# Patient Record
Sex: Female | Born: 1985 | Race: White | Hispanic: No | Marital: Single | Smoking: Never smoker
Health system: Southern US, Community
[De-identification: ages and names within clinical notes are randomized; demographics above are authoritative.]

---

## 2019-09-05 ENCOUNTER — Encounter (HOSPITAL_COMMUNITY): Payer: Self-pay | Admitting: Emergency Medicine

## 2019-09-05 ENCOUNTER — Emergency Department (HOSPITAL_COMMUNITY): Payer: Self-pay

## 2019-09-05 ENCOUNTER — Emergency Department (HOSPITAL_COMMUNITY)
Admission: EM | Admit: 2019-09-05 | Discharge: 2019-09-05 | Disposition: A | Discharge status: Home / Self Care | Payer: Self-pay | Attending: Emergency Medicine | Admitting: Emergency Medicine

## 2019-09-05 DIAGNOSIS — M5442 Lumbago with sciatica, left side: Secondary | ICD-10-CM | POA: Insufficient documentation

## 2019-09-05 MED ORDER — METHOCARBAMOL 500 MG PO TABS: 500.0000 mg | ORAL_TABLET | Freq: Two times a day (BID) | ORAL | 0 refills | Status: DC

## 2019-09-05 MED ORDER — MELOXICAM 7.5 MG PO TABS: 7.5000 mg | ORAL_TABLET | Freq: Every day | ORAL | 0 refills | Status: DC

## 2019-09-05 MED ORDER — HYDROCODONE-ACETAMINOPHEN 5-325 MG PO TABS
1.0000 | ORAL_TABLET | Freq: Once | ORAL | Status: AC
  Administered 2019-09-05: 1 via ORAL
  Filled 2019-09-05: qty 1

## 2019-09-05 MED ORDER — PREDNISONE 20 MG PO TABS: 40.0000 mg | ORAL_TABLET | Freq: Every day | ORAL | 0 refills | Status: AC

## 2019-09-05 MED ORDER — LIDOCAINE 5 % EX PTCH
1.0000 | MEDICATED_PATCH | CUTANEOUS | Status: DC
  Administered 2019-09-05: 16:00:00 1 via TRANSDERMAL
  Filled 2019-09-05: qty 1

## 2019-09-05 NOTE — ED Provider Notes (Signed)
[facility] EMERGENCY DEPARTMENT Provider Note   CSN: [id number] Arrival date & time: 09/05/19  1237     History Chief Complaint  Patient presents with  . Leg Pain    Roberta Bowman is a 33 y.o. female with no significant past medical history who presents to the ED due to gradual onset of worsening left lower back pain that radiates into the posterior aspect of the left leg for the past 3 to 4 days.  Patient notes she has had this pain in the past but is typically relieved with over-the-counter pain medication.  Patient denies recent injury.  Patient has tried over-the-counter Aleve, ice, heat, and elevation without relief.  Patient describes the pain as a stabbing, burning, and throbbing sensation that is worse with movement and ambulation.  Pain is relieved by staying stationary.  Back pain is associated with slight tingling of the left foot.  Patient denies fever, chills, lower extremity weakness, bowel/bladder incontinence, saddle paresthesias, IV drug use, and history of cancer.  Patient denies shortness of breath and chest pain. Patient denies urinary symptoms.   History reviewed. No pertinent past medical history.  There are no problems to display for this patient.   History reviewed. No pertinent surgical history.   OB History   No obstetric history on file.     No family history on file.  Social History   Tobacco Use  . Smoking status: Not on file  Substance Use Topics  . Alcohol use: Not on file  . Drug use: Not on file    Home Medications Prior to Admission medications   Medication Sig Start Date End Date Taking? Authorizing Provider  meloxicam (MOBIC) 7.5 MG tablet Take 1 tablet (7.5 mg total) by mouth daily. 09/05/19   Mannie Stabile, PA-C  methocarbamol (ROBAXIN) 500 MG tablet Take 1 tablet (500 mg total) by mouth 2 (two) times daily. 09/05/19   Mannie Stabile, PA-C  predniSONE (DELTASONE) 20 MG tablet Take 2 tablets (40 mg total)  by mouth daily for 5 days. 09/05/19 09/10/19  Mannie Stabile, PA-C    Allergies    Patient has no allergy information on record.  Review of Systems   Review of Systems  Constitutional: Negative for chills and fever.  Respiratory: Negative for shortness of breath.   Cardiovascular: Negative for chest pain.  Gastrointestinal: Negative for abdominal pain, constipation, diarrhea, nausea and vomiting.  Genitourinary: Negative for dysuria, flank pain, frequency and pelvic pain.  Musculoskeletal: Positive for back pain and gait problem. Negative for joint swelling.  Neurological: Positive for numbness. Negative for weakness.  All other systems reviewed and are negative.   Physical Exam Updated Vital Signs BP 131/84 (BP Location: Left Arm)   Pulse 78   Temp 98.6 F (37 C) (Oral)   Resp 16   LMP 08/21/2019 (Approximate)   SpO2 98%   Physical Exam Vitals and nursing note reviewed.  Constitutional:      General: She is not in acute distress.    Appearance: She is obese. She is not ill-appearing.  HENT:     Head: Normocephalic.  Eyes:     Conjunctiva/sclera: Conjunctivae normal.  Cardiovascular:     Rate and Rhythm: Normal rate and regular rhythm.     Pulses: Normal pulses.     Heart sounds: Normal heart sounds. No murmur. No friction rub. No gallop.   Pulmonary:     Effort: Pulmonary effort is normal.     Breath sounds: Normal  breath sounds.  Abdominal:     General: Abdomen is flat. There is no distension.     Palpations: Abdomen is soft.     Tenderness: There is no abdominal tenderness. There is no right CVA tenderness, left CVA tenderness, guarding or rebound.  Musculoskeletal:     Cervical back: Normal range of motion and neck supple. No rigidity.     Comments: No T-spine and L-spine midline tenderness, no stepoff or deformity, left lumbar reproducible paraspinal tenderness including left buttocks region. No leg edema bilaterally Patient moves all extremities without  difficulty. DP/PT pulses 2+ and equal bilaterally Sensation grossly intact bilaterally Strength of knee flexion and extension is 5/5 Plantar and dorsiflexion of ankle 5/5 Achilles and patellar reflexes present and equal Able to ambulate with a limp Positive left straight leg test   Skin:    General: Skin is warm and dry.  Neurological:     General: No focal deficit present.     Mental Status: She is alert.  Psychiatric:        Mood and Affect: Mood normal.     ED Results / Procedures / Treatments   Labs (all labs ordered are listed, but only abnormal results are displayed) Labs Reviewed - No data to display  EKG None  Radiology DG Lumbar Spine Complete  Result Date: 09/05/2019 CLINICAL DATA:  Low back pain with left lower extremity radicular symptoms EXAM: LUMBAR SPINE - COMPLETE 4+ VIEW COMPARISON:  None. FINDINGS: Frontal, lateral, spot lumbosacral lateral, and bilateral oblique views were obtained. There are 5 non-rib-bearing lumbar type vertebral bodies. There is no fracture or spondylolisthesis. There is slight disc space narrowing at L2-3. Other disc spaces appear normal. There is no appreciable facet arthropathy. IMPRESSION: Slight disc space narrowing at L2-3. Other disc spaces appear unremarkable. No appreciable facet arthropathy. No fracture or spondylolisthesis. Electronically Signed   By: Lowella Grip III M.D.   On: 09/05/2019 15:18    Procedures Procedures (including critical care time)  Medications Ordered in ED Medications  lidocaine (LIDODERM) 5 % 1 patch (1 patch Transdermal Patch Applied 09/05/19 1604)  HYDROcodone-acetaminophen (NORCO/VICODIN) 5-325 MG per tablet 1 tablet (1 tablet Oral Given 09/05/19 1603)    ED Course  I have reviewed the triage vital signs and the nursing notes.  Pertinent labs & imaging results that were available during my care of the patient were reviewed by me and considered in my medical decision making (see chart for  details).      33 year old female presents to the ED due to left-sided low back pain that radiates into posterior aspect of left leg x3 to 4 days. Vitals all within normal limits. Patient in no acute distress and non-toxic appearing. Left sided lumbar paraspinal tenderness into buttocks region. Positive left straight leg test. Normal strength of lower extremities. Neurovascularly intact. Patient able to ambulate in ED with mild limp.  Broad differential for back pain considered includes malignancy, disc herniation, spinal epidural abscess, spinal fracture, cauda equina, pyelonephritis, kidney stone, AAA, AD, pancreatitis, PE and PTX.   History without red flags (cancer, IVDU, weakness, saddle anesthesia, trauma, weight loss) and physical exam most consistent with lumbar radiculopathy. Doubt cauda equina or disc herniation due to lack of saddle anesthesia/bowel or bladder incontinence or urinary retention, normal gait and reassuring physical examination.  History is not supportive of kidney stone, AAA, AD, pancreatitis, PE or PTX. Patient has no CVA tenderness or urinary symptoms to suggest pyelonephritis or kidney stone.   Lumbar x-ray  personally reviewed which is negative for bony fractures with slight disc narrowing between L2-3. Pain controlled here in ED with Hydrocodone and lidoderm patch. Will manage patient conservatively at this time. NSAIDs, back exercises/stretches, heat therapy and follow up with PCP if symptoms do not resolve in 3-4 weeks. Patient offered muscle relaxer for comfort at night. Patient advised that medication can cause drowsiness, so to not drive or operate machinery while on the medication. Patient also given short burst of prednisone. Patient denies history of DM. No history of DM in chart review. Counseled on need to return to ED for fever, worsening or concerning symptoms. Orthopedic number given patient at discharge and encouraged to call and schedule an appointment for  further evaluation. Strict ED precautions discussed with patient. Patient states understanding and agrees to plan. Patient discharged home in no acute distress and stable vitals  MDM Rules/Calculators/A&P                       Final Clinical Impression(s) / ED Diagnoses Final diagnoses:  Acute left-sided low back pain with left-sided sciatica    Rx / DC Orders ED Discharge Orders         Ordered    predniSONE (DELTASONE) 20 MG tablet  Daily     09/05/19 1627    methocarbamol (ROBAXIN) 500 MG tablet  2 times daily     09/05/19 1627    meloxicam (MOBIC) 7.5 MG tablet  Daily     09/05/19 1627           Mannie Stabileberman,  C, PA-C 09/05/19 1816    Virgina NorfolkCuratolo, Adam, DO 09/06/19 1554

## 2019-09-05 NOTE — ED Triage Notes (Signed)
Pt to ER for lower back pain radiating to left leg, reports it as shooting pain. States has tried OTC aleve, ice, and elevation without relief. Ambulatory, reports pain with doing so. Denies loss of bowel/bladder control. NAD at this time.

## 2019-09-05 NOTE — ED Notes (Signed)
Pt transported to xray 

## 2019-09-05 NOTE — Discharge Instructions (Addendum)
As discussed, your x-ray today was negative for any fractures. I have included the number of the orthopedic doctor for further evaluation of your low back pain. I am sending you home with prednisone which is a steroid. Take 40mg  daily for 5 days to help with inflammation. I am also sending you home with Robaxin which is muscle relaxer. You may take it twice a day. Medicine can make you drowsy, so do not drive or operate machinery while on the medication. 3rd medication is meloxicam which is a pain medication. You may take it once a day as needed for pain. Do not mix with other over the counter medication. You may purchase over the counter voltaren gel and lidoderm patches as needed for pain. If your symptoms do not improve within the next week, follow-up with your PCP. Return to the ER for new or worsening symptoms.

## 2020-12-29 ENCOUNTER — Emergency Department (HOSPITAL_BASED_OUTPATIENT_CLINIC_OR_DEPARTMENT_OTHER): Payer: Self-pay

## 2020-12-29 ENCOUNTER — Emergency Department (HOSPITAL_BASED_OUTPATIENT_CLINIC_OR_DEPARTMENT_OTHER)
Admission: EM | Admit: 2020-12-29 | Discharge: 2020-12-29 | Disposition: A | Discharge status: Home / Self Care | Payer: Self-pay | Attending: Emergency Medicine | Admitting: Emergency Medicine

## 2020-12-29 ENCOUNTER — Encounter (HOSPITAL_BASED_OUTPATIENT_CLINIC_OR_DEPARTMENT_OTHER): Payer: Self-pay | Admitting: Emergency Medicine

## 2020-12-29 ENCOUNTER — Other Ambulatory Visit: Payer: Self-pay

## 2020-12-29 DIAGNOSIS — R1032 Left lower quadrant pain: Secondary | ICD-10-CM | POA: Insufficient documentation

## 2020-12-29 LAB — CK: Total CK: 163 U/L (ref 38–234)

## 2020-12-29 LAB — URINALYSIS, ROUTINE W REFLEX MICROSCOPIC
Bilirubin Urine: NEGATIVE
Glucose, UA: NEGATIVE mg/dL
Ketones, ur: NEGATIVE mg/dL
Nitrite: NEGATIVE
Protein, ur: 30 mg/dL — AB
Specific Gravity, Urine: <1.005 — ABNORMAL LOW (ref 1.005–1.030)
pH: 5.5 (ref 5.0–8.0)

## 2020-12-29 LAB — COMPREHENSIVE METABOLIC PANEL
ALT: 27 U/L (ref 0–44)
AST: 26 U/L (ref 15–41)
Albumin: 4.2 g/dL (ref 3.5–5.0)
Alkaline Phosphatase: 95 U/L (ref 38–126)
Anion gap: 13 (ref 5–15)
BUN: 12 mg/dL (ref 6–20)
CO2: 23 mmol/L (ref 22–32)
Calcium: 9.1 mg/dL (ref 8.9–10.3)
Chloride: 100 mmol/L (ref 98–111)
Creatinine, Ser: 0.75 mg/dL (ref 0.44–1.00)
GFR, Estimated: >60 mL/min (ref >60)
Glucose, Bld: 124 mg/dL — ABNORMAL HIGH (ref 70–99)
Potassium: 3.5 mmol/L (ref 3.5–5.1)
Sodium: 136 mmol/L (ref 135–145)
Total Bilirubin: 1.1 mg/dL (ref 0.3–1.2)
Total Protein: 8.6 g/dL — ABNORMAL HIGH (ref 6.5–8.1)

## 2020-12-29 LAB — CBC
HCT: 34.8 % — ABNORMAL LOW (ref 36.0–46.0)
Hemoglobin: 10.2 g/dL — ABNORMAL LOW (ref 12.0–15.0)
MCH: 20 pg — ABNORMAL LOW (ref 26.0–34.0)
MCHC: 29.3 g/dL — ABNORMAL LOW (ref 30.0–36.0)
MCV: 68.4 fL — ABNORMAL LOW (ref 80.0–100.0)
Platelets: 394 10*3/uL (ref 150–400)
RBC: 5.09 MIL/uL (ref 3.87–5.11)
RDW: 18.3 % — ABNORMAL HIGH (ref 11.5–15.5)
WBC: 14.4 10*3/uL — ABNORMAL HIGH (ref 4.0–10.5)
nRBC: 0 % (ref 0.0–0.2)

## 2020-12-29 LAB — PREGNANCY, URINE: Preg Test, Ur: NEGATIVE

## 2020-12-29 LAB — LIPASE, BLOOD: Lipase: 26 U/L (ref 11–51)

## 2020-12-29 MED ORDER — IOHEXOL 300 MG/ML  SOLN
80.0000 mL | Freq: Once | INTRAMUSCULAR | Status: AC | PRN
  Administered 2020-12-29: 100 mL via INTRAVENOUS

## 2020-12-29 MED ORDER — SODIUM CHLORIDE 0.9 % IV BOLUS
500.0000 mL | Freq: Once | INTRAVENOUS | Status: AC
  Administered 2020-12-29: 500 mL via INTRAVENOUS

## 2020-12-29 MED ORDER — KETOROLAC TROMETHAMINE 30 MG/ML IJ SOLN
30.0000 mg | Freq: Once | INTRAMUSCULAR | Status: AC
  Administered 2020-12-29: 30 mg via INTRAVENOUS
  Filled 2020-12-29: qty 1

## 2020-12-29 NOTE — ED Triage Notes (Signed)
Patient reports to the ER BIB EMS: Patient reports LLQ abdominal pain that started at 0700 this morning. No N/V/D. No blood in stool or urine, denies chance of pregnancy. Has a hx of gallstones.  Patient's vitals WDL HR 90 RR 20 97% on RA 161/09 BP

## 2020-12-29 NOTE — ED Provider Notes (Signed)
[facility] EMERGENCY DEPT Provider Note   CSN: [id number] Arrival date & time: 12/29/20  1328     History No chief complaint on file.   Roberta Bowman is a 35 y.o. female.  She is here with a complaint of left lateral abdominal pain radiating to left lower quadrant that started at 7 AM woke her up.  Sharp and stabbing.  7 out of 10 intensity.  Does not radiate into her back or down the leg.  Has had some constipation.  No urinary symptoms.  No fevers chills nausea vomiting or diarrhea.  The history is provided by the patient and the EMS personnel.  Abdominal Pain Pain location:  LLQ Pain quality: sharp and stabbing   Pain radiates to:  Does not radiate Pain severity now: 7/10. Onset quality:  Sudden Timing:  Constant Progression:  Unchanged Chronicity:  New Context: awakening from sleep   Relieved by:  Nothing Worsened by:  Nothing Ineffective treatments:  None tried Associated symptoms: constipation   Associated symptoms: no chest pain, no cough, no diarrhea, no dysuria, no fever, no hematemesis, no hematochezia, no hematuria, no nausea, no shortness of breath, no sore throat and no vomiting        History reviewed. No pertinent past medical history.  There are no problems to display for this patient.   History reviewed. No pertinent surgical history.   OB History   No obstetric history on file.     No family history on file.  Social History   Tobacco Use  . Smoking status: Never Smoker  . Smokeless tobacco: Never Used  Substance Use Topics  . Alcohol use: Yes  . Drug use: Not Currently    Home Medications Prior to Admission medications   Medication Sig Start Date End Date Taking? Authorizing Provider  meloxicam (MOBIC) 7.5 MG tablet Take 1 tablet (7.5 mg total) by mouth daily. 09/05/19   Mannie Stabile, PA-C  methocarbamol (ROBAXIN) 500 MG tablet Take 1 tablet (500 mg total) by mouth 2 (two) times daily. 09/05/19   Mannie Stabile, PA-C    Allergies    Patient has no known allergies.  Review of Systems   Review of Systems  Constitutional: Negative for fever.  HENT: Negative for sore throat.   Eyes: Negative for visual disturbance.  Respiratory: Negative for cough and shortness of breath.   Cardiovascular: Negative for chest pain.  Gastrointestinal: Positive for abdominal pain and constipation. Negative for diarrhea, hematemesis, hematochezia, nausea and vomiting.  Genitourinary: Negative for dysuria and hematuria.  Musculoskeletal: Negative for neck pain.  Skin: Negative for rash.  Neurological: Negative for headaches.    Physical Exam Updated Vital Signs BP 122/87   Pulse 96   Temp 98 F (36.7 C) (Oral)   Resp 16   Ht 5\' 4"  (1.626 m)   Wt 129.3 kg   LMP 12/28/2020   SpO2 99%   BMI 48.92 kg/m   Physical Exam Vitals and nursing note reviewed.  Constitutional:      General: She is not in acute distress.    Appearance: Normal appearance. She is well-developed.  HENT:     Head: Normocephalic and atraumatic.  Eyes:     Conjunctiva/sclera: Conjunctivae normal.  Cardiovascular:     Rate and Rhythm: Normal rate and regular rhythm.     Heart sounds: No murmur heard.   Pulmonary:     Effort: Pulmonary effort is normal. No respiratory distress.     Breath sounds: Normal breath  sounds. No stridor. No wheezing.  Abdominal:     Palpations: Abdomen is soft.     Tenderness: There is no abdominal tenderness. There is no guarding or rebound.  Musculoskeletal:        General: No tenderness. Normal range of motion.     Cervical back: Neck supple.  Skin:    General: Skin is warm and dry.  Neurological:     General: No focal deficit present.     Mental Status: She is alert.     GCS: GCS eye subscore is 4. GCS verbal subscore is 5. GCS motor subscore is 6.     ED Results / Procedures / Treatments   Labs (all labs ordered are listed, but only abnormal results are displayed) Labs Reviewed   COMPREHENSIVE METABOLIC PANEL - Abnormal; Notable for the following components:      Result Value   Glucose, Bld 124 (*)    Total Protein 8.6 (*)    All other components within normal limits  CBC - Abnormal; Notable for the following components:   WBC 14.4 (*)    Hemoglobin 10.2 (*)    HCT 34.8 (*)    MCV 68.4 (*)    MCH 20.0 (*)    MCHC 29.3 (*)    RDW 18.3 (*)    All other components within normal limits  URINALYSIS, ROUTINE W REFLEX MICROSCOPIC - Abnormal; Notable for the following components:   Color, Urine BROWN (*)    APPearance HAZY (*)    Specific Gravity, Urine <1.005 (*)    Hgb urine dipstick LARGE (*)    Protein, ur 30 (*)    Leukocytes,Ua MODERATE (*)    Bacteria, UA RARE (*)    All other components within normal limits  LIPASE, BLOOD  PREGNANCY, URINE  CK    EKG None  Radiology CT Abdomen Pelvis W Contrast  Result Date: 12/29/2020 CLINICAL DATA:  Left lower quadrant abdominal pain, diverticulitis suspected EXAM: CT ABDOMEN AND PELVIS WITH CONTRAST TECHNIQUE: Multidetector CT imaging of the abdomen and pelvis was performed using the standard protocol following bolus administration of intravenous contrast. CONTRAST:  OMNIPAQUE IOHEXOL 300 MG/ML  SOLN COMPARISON:  04/06/2018 FINDINGS: Lower chest: No acute abnormality. Hepatobiliary: No solid liver abnormality is seen. Hepatic steatosis. Large gallstone in the gallbladder. No gallbladder wall thickening, or biliary dilatation. Pancreas: Unremarkable. No pancreatic ductal dilatation or surrounding inflammatory changes. Spleen: Splenomegaly, maximum span 16.2 cm. Adrenals/Urinary Tract: Adrenal glands are unremarkable. Kidneys are normal, without renal calculi, solid lesion, or hydronephrosis. Bladder is unremarkable. Stomach/Bowel: Stomach is within normal limits. Appendix appears normal. No evidence of bowel wall thickening, distention, or inflammatory changes. Vascular/Lymphatic: No significant vascular findings  are present. No enlarged abdominal or pelvic lymph nodes. Reproductive: No mass or other significant abnormality. Small ovarian cysts and follicles. Nabothian cysts of the cervix. Other: No abdominal wall hernia or abnormality. Small volume nonspecific fluid in the low pelvis. Musculoskeletal: No acute or significant osseous findings. IMPRESSION: 1. No acute CT findings of the abdomen or pelvis to explain left lower quadrant pain. 2. Small volume nonspecific fluid in the low pelvis, which may be reactive or functional in the reproductive age setting. 3. Hepatic steatosis. 4. Cholelithiasis. 5. Splenomegaly. Electronically Signed   By: Lauralyn Primes M.D.   On: 12/29/2020 15:26    Procedures Procedures   Medications Ordered in ED Medications  sodium chloride 0.9 % bolus 500 mL (0 mLs Intravenous Stopped 12/29/20 1545)  ketorolac (TORADOL) 30 MG/ML  injection 30 mg (30 mg Intravenous Given 12/29/20 1411)  iohexol (OMNIPAQUE) 300 MG/ML solution 80 mL (100 mLs Intravenous Contrast Given 12/29/20 1502)    ED Course  I have reviewed the triage vital signs and the nursing notes.  Pertinent labs & imaging results that were available during my care of the patient were reviewed by me and considered in my medical decision making (see chart for details).  Clinical Course as of 12/29/20 1724  Mon Dec 29, 2020  1435 Last hemoglobin that I can find in Care Everywhere is from 7/19 and was 7.6. [MB]  1531 Patient's pain is improved.  I reviewed her results with her.  She is comfortable plan for discharge.  She said she does not have a primary care doctor she usually just comes to the emergency department when she needs to.  She is calling a friend for a ride home. [MB]    Clinical Course User Index [MB] Terrilee Files, MD   MDM Rules/Calculators/A&P                         This patient complains of left-sided abdominal pain; this involves an extensive number of treatment Options and is a complaint that  carries with it a high risk of complications and Morbidity. The differential includes diverticulitis, renal colic, musculoskeletal, constipation, pyelonephritis  I ordered, reviewed and interpreted labs, which included CBC with elevated white blood cell count, hemoglobin low but better than priors, chemistries and LFTs normal, urinalysis showing moderate leukocytes but 0-5 whites on micro nitrite negative I ordered medication IV fluids and IV Toradol provement in her symptoms I ordered imaging studies which included CT abdomen and pelvis and I independently    visualized and interpreted imaging which showed no acute findings Additional history obtained from EMS Previous records obtained and reviewed in epic, no recent admissions  After the interventions stated above, I reevaluated the patient and found patient to be symptomatically improved.  I reviewed her results with her.  She is feeling better after fluids and Toradol.  Return instructions discussed.   Final Clinical Impression(s) / ED Diagnoses Final diagnoses:  Left lower quadrant abdominal pain    Rx / DC Orders ED Discharge Orders    None       Terrilee Files, MD 12/29/20 1726

## 2020-12-29 NOTE — Discharge Instructions (Addendum)
You were seen in the emergency department for left-sided abdominal pain.  You had lab work urinalysis and a CAT scan of your abdomen and pelvis that did not show an obvious explanation for your pain.  Please start with a clear liquid diet and advance as tolerated.  Return to the emergency department if any worsening or concerning symptoms

## 2020-12-29 NOTE — ED Notes (Signed)
Patient transported to CT 

## 2021-02-03 ENCOUNTER — Encounter (HOSPITAL_COMMUNITY): Payer: Self-pay | Admitting: Emergency Medicine

## 2021-02-03 ENCOUNTER — Emergency Department (HOSPITAL_COMMUNITY)
Admission: EM | Admit: 2021-02-03 | Discharge: 2021-02-03 | Disposition: A | Discharge status: Home / Self Care | Payer: Self-pay | Attending: Emergency Medicine | Admitting: Emergency Medicine

## 2021-02-03 DIAGNOSIS — K029 Dental caries, unspecified: Secondary | ICD-10-CM | POA: Insufficient documentation

## 2021-02-03 DIAGNOSIS — K089 Disorder of teeth and supporting structures, unspecified: Secondary | ICD-10-CM | POA: Insufficient documentation

## 2021-02-03 DIAGNOSIS — K047 Periapical abscess without sinus: Secondary | ICD-10-CM

## 2021-02-03 MED ORDER — PENICILLIN V POTASSIUM 500 MG PO TABS: 500.0000 mg | ORAL_TABLET | Freq: Four times a day (QID) | ORAL | 0 refills | Status: AC

## 2021-02-03 MED ORDER — MELOXICAM 7.5 MG PO TABS: 7.5000 mg | ORAL_TABLET | Freq: Every day | ORAL | 0 refills | Status: AC | PRN

## 2021-02-03 NOTE — Discharge Instructions (Signed)
It was wonderful to see you today.  We have sent in a pain medication and antibiotic to help treat the infection.  You absolutely need to follow-up with a dentist despite if it feels better after antibiotics.  Please make sure you are brushing your teeth twice daily and avoid sugary drinks when you can.  You can take a maximum of 15 mg of meloxicam daily.  Do not take this with any ibuprofen, Aleve, or Motrin.  You can continue to use Tylenol up to 4000 mg daily, 1000 mg at 1 time.  Recommend frequent ice 20 minutes at a time several times a day in this area to help with discomfort.  Make sure that you continue drinking plenty of fluids and eat soft/liquid foods as tolerated.

## 2021-02-03 NOTE — ED Notes (Signed)
An After Visit Summary was printed and given to the patient. Discharge instructions given and no further questions at this time.  

## 2021-02-03 NOTE — ED Provider Notes (Signed)
[facility]-EMERGENCY DEPT Provider Note   CSN: [id number] Arrival date & time: 02/03/21  1449     History Chief Complaint  Patient presents with  . Dental Pain    Roberta Bowman is an otherwise healthy 35 y.o. female presenting for evaluation of dental pain.    She reports an approximate 3-4 history of left-sided upper/lower dental pain, worse in the left lower set associated with gingival swelling.  Sharp stabbing pain, only relief when she sleeps.  She has a known history of poor dentition.  Since onset of pain she has been brushing her teeth 2-3 times daily, however previously would brush about once a week.  Drinks sodas on a regular basis.  Last saw dentist about 2 years ago and currently does not have insurance.  She has been using Tylenol/ibuprofen with minimal relief.  Denies any fever, chills, rash, facial numbness/weakness.       History reviewed. No pertinent past medical history.  There are no problems to display for this patient.   History reviewed. No pertinent surgical history.   OB History   No obstetric history on file.     No family history on file.  Social History   Tobacco Use  . Smoking status: Never Smoker  . Smokeless tobacco: Never Used  Substance Use Topics  . Alcohol use: Yes  . Drug use: Not Currently    Home Medications Prior to Admission medications   Medication Sig Start Date End Date Taking? Authorizing Provider  meloxicam (MOBIC) 7.5 MG tablet Take 1 tablet (7.5 mg total) by mouth daily as needed for up to 20 days for pain. 02/03/21 02/23/21 Yes Leticia Penna N, DO  penicillin v potassium (VEETID) 500 MG tablet Take 1 tablet (500 mg total) by mouth 4 (four) times daily for 7 days. 02/03/21 02/10/21 Yes Jenicka Coxe, Janace Litten, DO  methocarbamol (ROBAXIN) 500 MG tablet Take 1 tablet (500 mg total) by mouth 2 (two) times daily. 09/05/19   Mannie Stabile, PA-C    Allergies    Patient has no known allergies.  Review of  Systems   Review of Systems  Constitutional: Negative for chills, fatigue and fever.  HENT: Positive for dental problem. Negative for facial swelling, trouble swallowing and voice change.   Respiratory: Negative for shortness of breath.   Cardiovascular: Negative for chest pain.  Skin: Negative for pallor and rash.  Neurological: Negative for dizziness and light-headedness.    Physical Exam Updated Vital Signs BP (!) 145/113 (BP Location: Right Arm)   Pulse (!) 114   Temp 99.6 F (37.6 C) (Oral)   Resp 16   SpO2 95%   Physical Exam Constitutional:      General: She is not in acute distress.    Appearance: Normal appearance. She is not ill-appearing or toxic-appearing.  HENT:     Head: Normocephalic and atraumatic.     Nose: Nose normal.     Mouth/Throat:     Mouth: Mucous membranes are moist.     Comments: Poor dentition throughout.  Cracked and decayed posterior left upper/lower molars with surrounding gingival hyperplasia and erythema.  No fluid collection or fluctuance palpated.  Picture below. Eyes:     Extraocular Movements: Extraocular movements intact.  Cardiovascular:     Rate and Rhythm: Normal rate.     Pulses: Normal pulses.     Heart sounds: No murmur heard.   Pulmonary:     Effort: Pulmonary effort is normal.  Abdominal:  Palpations: Abdomen is soft.  Musculoskeletal:     Cervical back: Neck supple. No rigidity.  Lymphadenopathy:     Cervical: No cervical adenopathy.  Skin:    General: Skin is warm and dry.     Capillary Refill: Capillary refill takes less than 2 seconds.     Findings: No rash.     Comments: No overlying erythema or soft tissue swelling present.  Neurological:     Mental Status: She is alert and oriented to person, place, and time.  Psychiatric:        Behavior: Behavior normal.          ED Results / Procedures / Treatments   Labs (all labs ordered are listed, but only abnormal results are displayed) Labs Reviewed - No  data to display  EKG None  Radiology No results found.  Procedures Procedures   Medications Ordered in ED Medications - No data to display  ED Course  I have reviewed the triage vital signs and the nursing notes.  Pertinent labs & imaging results that were available during my care of the patient were reviewed by me and considered in my medical decision making (see chart for details).    MDM Rules/Calculators/A&P                          35 year old female with a history of poor dentition presenting for evaluation of acute left-sided dental pain.  Afebrile, hemodynamically stable, and well-appearing on exam.  Notable gingival hyperplasia and erythema surrounding her left lower molars consistent with dental infection, however no evidence of abscess or overlying cellulitis.  Rx'd penicillin X 7 days and meloxicam for pain control.  Encourage supportive care with frequent toothbrushing, reducing sugary drinks, and can continue ice/heat/Tylenol.  Provided with dental resources in the community, needs to follow-up with dentistry.  ED precautions discussed.   Final Clinical Impression(s) / ED Diagnoses Final diagnoses:  Dental infection  Pain due to dental caries    Rx / DC Orders ED Discharge Orders         Ordered    penicillin v potassium (VEETID) 500 MG tablet  4 times daily        02/03/21 1629    meloxicam (MOBIC) 7.5 MG tablet  Daily PRN        02/03/21 1629           Allayne Stack, DO 02/03/21 1658    Milagros Loll, MD 02/04/21 2124

## 2021-02-03 NOTE — ED Triage Notes (Signed)
Per EMS-back bottom left dental pain-poor dental hygiene-has not seen a dentist for her symptems

## 2021-02-05 ENCOUNTER — Ambulatory Visit: Admission: EM | Admit: 2021-02-05 | Discharge: 2021-02-05 | Discharge status: Against Medical Advice | Payer: Self-pay

## 2021-02-05 ENCOUNTER — Other Ambulatory Visit: Payer: Self-pay

## 2021-09-03 ENCOUNTER — Other Ambulatory Visit: Payer: Self-pay

## 2021-09-03 ENCOUNTER — Inpatient Hospital Stay (HOSPITAL_COMMUNITY)
Admission: EM | Admit: 2021-09-03 | Discharge: 2021-09-04 | DRG: 158 | Disposition: A | Discharge status: Home / Self Care | Payer: Self-pay | Attending: Student in an Organized Health Care Education/Training Program | Admitting: Student in an Organized Health Care Education/Training Program

## 2021-09-03 ENCOUNTER — Emergency Department (HOSPITAL_COMMUNITY): Payer: Self-pay

## 2021-09-03 ENCOUNTER — Encounter (HOSPITAL_COMMUNITY): Payer: Self-pay | Admitting: Student in an Organized Health Care Education/Training Program

## 2021-09-03 DIAGNOSIS — Z560 Unemployment, unspecified: Secondary | ICD-10-CM

## 2021-09-03 DIAGNOSIS — K122 Cellulitis and abscess of mouth: Secondary | ICD-10-CM | POA: Diagnosis present

## 2021-09-03 DIAGNOSIS — D509 Iron deficiency anemia, unspecified: Secondary | ICD-10-CM | POA: Diagnosis present

## 2021-09-03 DIAGNOSIS — Z79899 Other long term (current) drug therapy: Secondary | ICD-10-CM

## 2021-09-03 DIAGNOSIS — R6884 Jaw pain: Secondary | ICD-10-CM | POA: Diagnosis present

## 2021-09-03 DIAGNOSIS — K029 Dental caries, unspecified: Secondary | ICD-10-CM | POA: Diagnosis present

## 2021-09-03 DIAGNOSIS — K047 Periapical abscess without sinus: Principal | ICD-10-CM | POA: Diagnosis present

## 2021-09-03 DIAGNOSIS — L0201 Cutaneous abscess of face: Secondary | ICD-10-CM

## 2021-09-03 DIAGNOSIS — E669 Obesity, unspecified: Secondary | ICD-10-CM | POA: Diagnosis present

## 2021-09-03 DIAGNOSIS — K0381 Cracked tooth: Secondary | ICD-10-CM | POA: Diagnosis present

## 2021-09-03 DIAGNOSIS — Z20822 Contact with and (suspected) exposure to covid-19: Secondary | ICD-10-CM | POA: Diagnosis present

## 2021-09-03 DIAGNOSIS — Z6841 Body Mass Index (BMI) 40.0 and over, adult: Secondary | ICD-10-CM

## 2021-09-03 LAB — CBC WITH DIFFERENTIAL/PLATELET
Abs Immature Granulocytes: 0.2 10*3/uL — ABNORMAL HIGH (ref 0.00–0.07)
Basophils Absolute: 0 10*3/uL (ref 0.0–0.1)
Basophils Relative: 0 %
Eosinophils Absolute: 0.1 10*3/uL (ref 0.0–0.5)
Eosinophils Relative: 0 %
HCT: 34 % — ABNORMAL LOW (ref 36.0–46.0)
Hemoglobin: 9.3 g/dL — ABNORMAL LOW (ref 12.0–15.0)
Immature Granulocytes: 1 %
Lymphocytes Relative: 14 %
Lymphs Abs: 2.6 10*3/uL (ref 0.7–4.0)
MCH: 19.2 pg — ABNORMAL LOW (ref 26.0–34.0)
MCHC: 27.4 g/dL — ABNORMAL LOW (ref 30.0–36.0)
MCV: 70.2 fL — ABNORMAL LOW (ref 80.0–100.0)
Monocytes Absolute: 1.4 10*3/uL — ABNORMAL HIGH (ref 0.1–1.0)
Monocytes Relative: 8 %
Neutro Abs: 14 10*3/uL — ABNORMAL HIGH (ref 1.7–7.7)
Neutrophils Relative %: 77 %
Platelets: 436 10*3/uL — ABNORMAL HIGH (ref 150–400)
RBC: 4.84 MIL/uL (ref 3.87–5.11)
RDW: 17.3 % — ABNORMAL HIGH (ref 11.5–15.5)
WBC: 18.2 10*3/uL — ABNORMAL HIGH (ref 4.0–10.5)
nRBC: 0 % (ref 0.0–0.2)

## 2021-09-03 LAB — BASIC METABOLIC PANEL
Anion gap: 13 (ref 5–15)
BUN: 6 mg/dL (ref 6–20)
CO2: 27 mmol/L (ref 22–32)
Calcium: 9.4 mg/dL (ref 8.9–10.3)
Chloride: 96 mmol/L — ABNORMAL LOW (ref 98–111)
Creatinine, Ser: 0.74 mg/dL (ref 0.44–1.00)
GFR, Estimated: >60 mL/min (ref >60)
Glucose, Bld: 123 mg/dL — ABNORMAL HIGH (ref 70–99)
Potassium: 3.3 mmol/L — ABNORMAL LOW (ref 3.5–5.1)
Sodium: 136 mmol/L (ref 135–145)

## 2021-09-03 LAB — RESP PANEL BY RT-PCR (FLU A&B, COVID) ARPGX2
Influenza A by PCR: NEGATIVE
Influenza B by PCR: NEGATIVE
SARS Coronavirus 2 by RT PCR: NEGATIVE

## 2021-09-03 LAB — I-STAT BETA HCG BLOOD, ED (MC, WL, AP ONLY): I-stat hCG, quantitative: <5 m[IU]/mL (ref <5)

## 2021-09-03 MED ORDER — KETOROLAC TROMETHAMINE 30 MG/ML IJ SOLN
30.0000 mg | Freq: Four times a day (QID) | INTRAMUSCULAR | Status: DC | PRN
  Administered 2021-09-03: 30 mg via INTRAVENOUS
  Filled 2021-09-03: qty 1

## 2021-09-03 MED ORDER — ENOXAPARIN SODIUM 80 MG/0.8ML IJ SOSY
65.0000 mg | PREFILLED_SYRINGE | INTRAMUSCULAR | Status: DC
  Administered 2021-09-03: 65 mg via SUBCUTANEOUS
  Filled 2021-09-03: qty 0.65
  Filled 2021-09-03: qty 0.8

## 2021-09-03 MED ORDER — POTASSIUM CHLORIDE 20 MEQ PO PACK
40.0000 meq | PACK | Freq: Once | ORAL | Status: DC
  Filled 2021-09-03: qty 2

## 2021-09-03 MED ORDER — LACTATED RINGERS IV SOLN: INTRAVENOUS | Status: DC

## 2021-09-03 MED ORDER — IOHEXOL 300 MG/ML  SOLN
75.0000 mL | Freq: Once | INTRAMUSCULAR | Status: AC | PRN
  Administered 2021-09-03: 75 mL via INTRAVENOUS

## 2021-09-03 MED ORDER — SODIUM CHLORIDE 0.9 % IV SOLN
3.0000 g | Freq: Four times a day (QID) | INTRAVENOUS | Status: DC
  Administered 2021-09-03 – 2021-09-04 (×2): 3 g via INTRAVENOUS
  Filled 2021-09-03 (×5): qty 8

## 2021-09-03 MED ORDER — POTASSIUM CHLORIDE 10 MEQ/100ML IV SOLN
10.0000 meq | INTRAVENOUS | Status: DC
  Administered 2021-09-03: 10 meq via INTRAVENOUS
  Filled 2021-09-03 (×2): qty 100

## 2021-09-03 MED ORDER — SODIUM CHLORIDE 0.9 % IV SOLN
3.0000 g | Freq: Once | INTRAVENOUS | Status: AC
  Administered 2021-09-03: 3 g via INTRAVENOUS
  Filled 2021-09-03: qty 8

## 2021-09-03 NOTE — ED Notes (Signed)
Pt brought back from CT after unsuccessful IV attempts

## 2021-09-03 NOTE — ED Provider Notes (Signed)
Emergency Medicine Provider Triage Evaluation Note  Roberta Bowman , a 35 y.o. female  was evaluated in triage.  Pt complains of swelling to left submandibular space.  Swelling has been present over the last 2 to 3 days.  Swelling has gotten progressively worse over this time.  Patient complains of left-sided dental pain, trouble swallowing, trismus, and drooling.  Review of Systems  Positive: Swelling to left mandibular space, dental pain, trouble swallowing, trismus, drooling Negative: Fever, chills, neck pain, neck stiffness  Physical Exam  BP (!) 129/94    Pulse (!) 113    Temp 98.5 F (36.9 C)    Resp 16    SpO2 98%  Gen:   Awake, no distress   Resp:  Normal effort  MSK:   Moves extremities without difficulty  Other:  Poor dentition with multiple dental caries.  Swelling to left submandibular space.  Pain with passive neck flexion.  Handles oral secretions without difficulty.  Medical Decision Making  Medically screening exam initiated at 11:41 AM.  Appropriate orders placed.  Roberta Bowman was informed that the remainder of the evaluation will be completed by another provider, this initial triage assessment does not replace that evaluation, and the importance of remaining in the ED until their evaluation is complete.  Swelling to submandibular space, CT soft tissue neck with contrast ordered   Haskel Schroeder, PA-C 09/03/21 1143    Tegeler, Canary Brim, MD 09/03/21 [phone]

## 2021-09-03 NOTE — ED Provider Notes (Signed)
[facility] EMERGENCY DEPARTMENT Provider Note   CSN: [id number] Arrival date & time: 09/03/21  1058     History Chief Complaint  Patient presents with   Sore Throat    With dental pain    Bridget Mack is a 35 y.o. female.  The history is provided by the patient.  Patient is had about 3 days of swelling in her face.  Pain in the jaw.  Difficulty opening her jaw.  States she has had a dental infection of the spread before.  Goes in to her left lower jaw and down below.  States she feels if she has trouble breathing and swallowing.  She is handling her secretions at this time.  No fevers.  States she has had symptoms like this before but never this bad.    No past medical history on file.  There are no problems to display for this patient.   No past surgical history on file.   OB History   No obstetric history on file.     No family history on file.  Social History   Tobacco Use   Smoking status: Never   Smokeless tobacco: Never  Substance Use Topics   Alcohol use: Yes   Drug use: Not Currently    Home Medications Prior to Admission medications   Medication Sig Start Date End Date Taking? Authorizing Provider  methocarbamol (ROBAXIN) 500 MG tablet Take 1 tablet (500 mg total) by mouth 2 (two) times daily. 09/05/19   Mannie Stabile, PA-C    Allergies    Patient has no known allergies.  Review of Systems   Review of Systems  Constitutional:  Negative for appetite change.  HENT:  Positive for dental problem, facial swelling and trouble swallowing.   Respiratory:  Negative for shortness of breath.   Cardiovascular:  Negative for chest pain.  Gastrointestinal:  Negative for abdominal pain.  Genitourinary:  Negative for flank pain.  Musculoskeletal:  Negative for back pain.  Skin:  Negative for rash.  Neurological:  Negative for weakness.   Physical Exam Updated Vital Signs BP 132/77    Pulse 95    Temp 98.4 F (36.9 C) (Oral)     Resp 18    Ht 5\' 4"  (1.626 m)    Wt 130 kg    SpO2 100%    BMI 49.19 kg/m   Physical Exam Vitals and nursing note reviewed.  HENT:     Head: Normocephalic.     Mouth/Throat:     Comments: Poor dentition.  Only opens mouth about 2 finger widths.  Tender induration and swelling on the left submandibular space.  Difficult to feel inside the jaw due to the trismus.  No stridor.  Appears to progress somewhat posteriorly to about the angle of the jaw. Cardiovascular:     Rate and Rhythm: Regular rhythm.  Pulmonary:     Breath sounds: No wheezing.  Abdominal:     Tenderness: There is no abdominal tenderness.  Musculoskeletal:        General: No tenderness.     Cervical back: Neck supple.  Skin:    General: Skin is warm.     Capillary Refill: Capillary refill takes less than 2 seconds.  Neurological:     Mental Status: She is alert and oriented to person, place, and time.    ED Results / Procedures / Treatments   Labs (all labs ordered are listed, but only abnormal results are displayed) Labs Reviewed  BASIC  METABOLIC PANEL - Abnormal; Notable for the following components:      Result Value   Potassium 3.3 (*)    Chloride 96 (*)    Glucose, Bld 123 (*)    All other components within normal limits  CBC WITH DIFFERENTIAL/PLATELET - Abnormal; Notable for the following components:   WBC 18.2 (*)    Hemoglobin 9.3 (*)    HCT 34.0 (*)    MCV 70.2 (*)    MCH 19.2 (*)    MCHC 27.4 (*)    RDW 17.3 (*)    Platelets 436 (*)    Neutro Abs 14.0 (*)    Monocytes Absolute 1.4 (*)    Abs Immature Granulocytes 0.20 (*)    All other components within normal limits  RESP PANEL BY RT-PCR (FLU A&B, COVID) ARPGX2  I-STAT BETA HCG BLOOD, ED ([facility], [facility], [facility] ONLY)    EKG None  Radiology CT Soft Tissue Neck W Contrast  Result Date: 09/03/2021 CLINICAL DATA:  Neck mass, nonpulsatile Swelling to submandibular space EXAM: CT NECK WITH CONTRAST TECHNIQUE: Multidetector CT imaging of the neck was  performed using the standard protocol following the bolus administration of intravenous contrast. CONTRAST:  35mL OMNIPAQUE IOHEXOL 300 MG/ML  SOLN COMPARISON:  None. FINDINGS: Pharynx and larynx: No evidence of a mass. Salivary glands: Edema surrounding the left submandibular gland from the process described below. Otherwise, the submandibular and parotid glands are unremarkable. Thyroid: Normal. Lymph nodes: Enlarged left submandibular and left greater than right upper cervical chain nodes. Vascular: Limited evaluation due to non arterial timing with major arteries in the neck appearing grossly patent. Limited intracranial: Unremarkable. Visualized orbits: Negative. Mastoids and visualized paranasal sinuses: Mild mucosal thickening of the inferior maxillary sinuses. Trace left mastoid fluid. Skeleton: No acute abnormality. Upper chest: Visualized lung apices are clear. Other: Edema and soft tissue thickening along the inferior aspect of the left mandibular body with approximately 2.3 x 2.3 by 1.4 cm fluid collection with peripheral enhancement, compatible with abscess. There is a low-attenuation fluid tract extending superiorly to the lingual aspect of the left mandibular body with periapical lucency and lingual cortical breakthrough of the posterior-most left mandibular molar in this region (series 5, images 35/36). Edema/cellulitis extends inferiorly along the platysma into the upper neck and posteriorly into the submandibular space. IMPRESSION: 1. Findings compatible with cellulitis/phlegmon centered along the inferior aspect of the left mandibular body with 2.3 cm abscess in this region. Findings are likely odontogenic in etiology given tubular fluid tract extending superiorly to the posterior-most left mandibular molar with overlying periapical lucency and lingual cortical breakthrough. 2. Enlarged left submandibular and left greater than right upper cervical chain nodes, nonspecific but likely reactive given  the above findings. Electronically Signed   By: Feliberto Harts M.D.   On: 09/03/2021 16:54    Procedures Procedures   Medications Ordered in ED Medications  Ampicillin-Sulbactam (UNASYN) 3 g in sodium chloride 0.9 % 100 mL IVPB (0 g Intravenous Stopped 09/03/21 1620)  iohexol (OMNIPAQUE) 300 MG/ML solution 75 mL (75 mLs Intravenous Contrast Given 09/03/21 1638)    ED Course  I have reviewed the triage vital signs and the nursing notes.  Pertinent labs & imaging results that were available during my care of the patient were reviewed by me and considered in my medical decision making (see chart for details).    MDM Rules/Calculators/A&P  Patient with facial pain and swelling.  Has had for the last few days.  White count elevated.  CT scan done and showed abscess and phlegmon.  Likely odontogenic origin.  Does not appear septic at this time.  However is not able to eat and drink due to the pain.  IV antibiotics have been given.  Previously had discussed with Dr. Jearld Fenton from ENT prior to his CT scan being back.  Stated that if it is a odontogenic origin it would be oral surgery consult.  Dr. Ross Marcus is on-call this week but his call ends at 4:00.  I think it is reasonable to consult him tomorrow for further management if needed but will require medicine admission. Does not appear to have any airway threatening infection at this time      Final Clinical Impression(s) / ED Diagnoses Final diagnoses:  Dental abscess  Facial abscess    Rx / DC Orders ED Discharge Orders     None        Benjiman Core, MD 09/03/21 1728

## 2021-09-03 NOTE — ED Triage Notes (Signed)
Pt here with reports of swelling below her L jaw onset 3 days ago. Pt endorses difficulty swallowing and feels as if her airway is partially blocked. Pt speaking in complete sentences. Managing her secretions.

## 2021-09-03 NOTE — H&P (Addendum)
Date: 09/03/2021               Patient Name:  Roberta Bowman MRN: [id number]  DOB: Aug 25, 1986 Age / Sex: 35 y.o., female   PCP: Patient, No Pcp Per (Inactive)         Medical Service: Internal Medicine Teaching Service         Attending Physician: Dr. Oswaldo Done, Marquita Palms, *    First Contact: Dr. Burnice Logan Pager: [phone]  Second Contact: Dr. Quincy Simmonds Pager: [phone]       After Hours (After 5p/  First Contact Pager: [phone]  weekends / holidays): Second Contact Pager: [phone]   Chief Complaint:   History of Present Illness:  Roberta Bowman is a 35 year old person with history of dental carries who presents for swelling of the left jaw. States this started about 3 or 4 days ago. Notes associated pain with swallowing. Notes that she can swallow fluids easily but feels that solids get stuck in throat and she has to cough food back up. Also notes some difficulty with breathing when laying on her right side. Denies fever, chills, headache, eye pain, nausea, vomiting. States had a similar pain in her mouth about 1 year ago that resolved after antibiotics. States she has had issues with dental carries but has been unable to afford dental care. Also notes she does not have a car and has difficulty going to appointments.   Meds:  Current Meds  Medication Sig   acetaminophen (TYLENOL) 500 MG tablet Take 500 mg by mouth every 6 (six) hours as needed for moderate pain.     Allergies: Allergies as of 09/03/2021   (No Known Allergies)   No past medical history on file.  Family History: Denies history of DM, cancer, heart disease  Social History: Live alone, does not have a car unable to get reliable transportation. Currently unemployed,  denies tobacco and alcohol history.   Review of Systems: A complete ROS was negative except as per HPI.   Physical Exam: Blood pressure 139/81, pulse (!) 106, temperature 98.7 F (37.1 C), temperature source Oral, resp. rate 18, height 5'  4" (1.626 m), weight 130 kg, SpO2 99 %. Physical Exam Constitutional:      General: She is not in acute distress.    Appearance: She is well-developed. She is obese. She is not ill-appearing, toxic-appearing or diaphoretic.  HENT:     Head: Normocephalic and atraumatic.     Nose: No rhinorrhea.     Mouth/Throat:     Mouth: Mucous membranes are moist. No oral lesions.     Pharynx: Oropharynx is clear. No oropharyngeal exudate, posterior oropharyngeal erythema or uvula swelling.     Tonsils: No tonsillar exudate or tonsillar abscesses.     Comments: Poor dentition, firm nodule left jaw, tender to palpation, airway clear Cardiovascular:     Rate and Rhythm: Normal rate and regular rhythm.  Pulmonary:     Effort: Pulmonary effort is normal. No respiratory distress.     Breath sounds: Normal breath sounds. No wheezing or rales.  Abdominal:     General: Bowel sounds are normal.     Palpations: Abdomen is soft.  Musculoskeletal:     Cervical back: Normal range of motion and neck supple.  Skin:    General: Skin is warm and dry.     Coloration: Skin is not pale.     Findings: No erythema or rash.  Neurological:     General: No  focal deficit present.     Mental Status: She is alert and oriented to person, place, and time.  Psychiatric:        Mood and Affect: Mood normal.        Behavior: Behavior normal.     EKG: personally reviewed my interpretation is none  CXR: personally reviewed my interpretation is none  Assessment & Plan by Problem: Principal Problem:   Dental abscess  # Odontogenic abscess, likely secondary to poor dentition/oral hygiene - CT head showing cellulitis/phlegmon along inferior aspect of left mandibular body with 2.3cm abscess.  - patient able to speak in full sentences, no signs of airway compromise, able to clear oral secretions. Able to swallow liquids but painful swallowing of solids. No chest pain. - receiving unasyn 3g q6hr. - toradol 30mg  for pain -  NPO for now, SLP consulted - will talk with oral surgery in AM as they are not on call after 4pm - continue to monitor with daily CBC and BMP  Microcytic anemia - asymptomatic currently - check ferritin and iron panel, suspect iron deficiency  Hypokalemia 3.3, repleted - will continue to monitor and replete as necessary  Dispo: Admit patient to Inpatient with expected length of stay greater than 2 midnights.  Signed: , MD 09/03/2021, 8:54 PM  Pager: [phone] After 5pm on weekdays and 1pm on weekends: On Call pager: [phone]

## 2021-09-03 NOTE — ED Notes (Signed)
Patient transported to CT 

## 2021-09-04 ENCOUNTER — Observation Stay (HOSPITAL_COMMUNITY): Payer: Self-pay

## 2021-09-04 ENCOUNTER — Other Ambulatory Visit (HOSPITAL_COMMUNITY): Payer: Self-pay

## 2021-09-04 DIAGNOSIS — K047 Periapical abscess without sinus: Principal | ICD-10-CM

## 2021-09-04 DIAGNOSIS — D509 Iron deficiency anemia, unspecified: Secondary | ICD-10-CM | POA: Diagnosis present

## 2021-09-04 LAB — BASIC METABOLIC PANEL
Anion gap: 9 (ref 5–15)
BUN: 6 mg/dL (ref 6–20)
CO2: 27 mmol/L (ref 22–32)
Calcium: 8.7 mg/dL — ABNORMAL LOW (ref 8.9–10.3)
Chloride: 97 mmol/L — ABNORMAL LOW (ref 98–111)
Creatinine, Ser: 0.76 mg/dL (ref 0.44–1.00)
GFR, Estimated: >60 mL/min (ref >60)
Glucose, Bld: 98 mg/dL (ref 70–99)
Potassium: 2.9 mmol/L — ABNORMAL LOW (ref 3.5–5.1)
Sodium: 133 mmol/L — ABNORMAL LOW (ref 135–145)

## 2021-09-04 LAB — FERRITIN: Ferritin: 25 ng/mL (ref 11–307)

## 2021-09-04 LAB — CBC
HCT: 28.4 % — ABNORMAL LOW (ref 36.0–46.0)
Hemoglobin: 8.1 g/dL — ABNORMAL LOW (ref 12.0–15.0)
MCH: 19.8 pg — ABNORMAL LOW (ref 26.0–34.0)
MCHC: 28.5 g/dL — ABNORMAL LOW (ref 30.0–36.0)
MCV: 69.4 fL — ABNORMAL LOW (ref 80.0–100.0)
Platelets: 340 10*3/uL (ref 150–400)
RBC: 4.09 MIL/uL (ref 3.87–5.11)
RDW: 17.4 % — ABNORMAL HIGH (ref 11.5–15.5)
WBC: 13.9 10*3/uL — ABNORMAL HIGH (ref 4.0–10.5)
nRBC: 0 % (ref 0.0–0.2)

## 2021-09-04 LAB — IRON AND TIBC
Iron: 22 ug/dL — ABNORMAL LOW (ref 28–170)
Saturation Ratios: 6 % — ABNORMAL LOW (ref 10.4–31.8)
TIBC: 357 ug/dL (ref 250–450)
UIBC: 335 ug/dL

## 2021-09-04 LAB — HEMOGLOBIN A1C
Hgb A1c MFr Bld: 5.1 % (ref 4.8–5.6)
Mean Plasma Glucose: 99.67 mg/dL

## 2021-09-04 LAB — HIV ANTIBODY (ROUTINE TESTING W REFLEX): HIV Screen 4th Generation wRfx: NONREACTIVE

## 2021-09-04 LAB — MAGNESIUM: Magnesium: 2 mg/dL (ref 1.7–2.4)

## 2021-09-04 MED ORDER — CHLORHEXIDINE GLUCONATE CLOTH 2 % EX PADS: 6.0000 | MEDICATED_PAD | Freq: Once | CUTANEOUS | Status: DC

## 2021-09-04 MED ORDER — POTASSIUM CHLORIDE 20 MEQ PO PACK
40.0000 meq | PACK | Freq: Every day | ORAL | Status: DC
  Administered 2021-09-04: 40 meq via ORAL
  Filled 2021-09-04: qty 2

## 2021-09-04 MED ORDER — FERROUS SULFATE 28 MG PO TABS
1.0000 | ORAL_TABLET | Freq: Every day | ORAL | 0 refills | Status: AC
  Filled 2021-09-04: qty 30, 30d supply, fill #0

## 2021-09-04 MED ORDER — SODIUM CHLORIDE 0.9 % IV SOLN: 3.0000 g | INTRAVENOUS | Status: DC

## 2021-09-04 MED ORDER — AMOXICILLIN-POT CLAVULANATE 875-125 MG PO TABS
1.0000 | ORAL_TABLET | Freq: Two times a day (BID) | ORAL | 0 refills | Status: AC
  Filled 2021-09-04: qty 14, 7d supply, fill #0

## 2021-09-04 NOTE — Discharge Summary (Addendum)
Name: Roberta Bowman MRN: [id number] DOB: March 28, 1986 35 y.o. PCP: Patient, No Pcp Per (Inactive)  Date of Admission: 09/03/2021 11:30 AM Date of Discharge: 09/04/21 Attending Physician: Dr. Oswaldo Done  Discharge Diagnosis: Principal Problem:   Dental abscess Active Problems:   Microcytic anemia    Discharge Medications: Allergies as of 09/04/2021   No Known Allergies      Medication List     STOP taking these medications    acetaminophen 500 MG tablet Commonly known as: TYLENOL   methocarbamol 500 MG tablet Commonly known as: ROBAXIN       TAKE these medications    amoxicillin-clavulanate 875-125 MG tablet Commonly known as: Augmentin Take 1 tablet by mouth 2 (two) times daily for 7 days.   Ferrous Sulfate 28 MG Tabs Take 1 tablet (28 mg total) by mouth daily.        Disposition and follow-up:   Roberta Bowman was discharged from [facility] in [place] condition. Patient left AMA. At the hospital follow up visit please address:  1.  Follow-up:  a. Odontogenic infection    b. Microcytic anemia    c. hypokalemia   d.  2.  Labs / imaging needed at time of follow-up: cbc, cmp  3.  Pending labs/ test needing follow-up: none  4.  Medication Changes  Started: augmentin, ferrous sulfate  Stopped: none  Changed: none  Abx -  Augmentin 875-125mg  BID for 7 days     End Date: 09/11/21  Follow-up Appointments:  Follow-up Information     [facility] Follow up.   Why: October 19, 2021 at 2:30 pm Contact information: [address] [place] [address] [phone]                Hospital Course by problem list:   Odontogenic abscess, likely secondary to poor dentition/oral hygiene Patient presented to the emergency department with complaints of pain and swelling in her jaw. CT imaging of her head revealed cellulitis/phlegmon and a 2.3 cm abscess along the inferior aspect of  her left mandible. She was treated with IV unasyn and oral surgery was consulted. Xrays showed lucency in the mandible adjacent to #18 consistent with deep tooth infection and inflammation. Surgery recommended tooth extraction and drainage of the abscess. The patient ultimately declined surgical intervention. Her symptoms improved with IV antibiotics and she was tolerating a regular diet with some improvement in trismus. We spoke with her about the risk of progressive infection which could include deep neck space abscess, sepsis, Ludwig angina, and possibly death. We recommended against medical management alone as it has a high failure rate in the setting of abscess. The patient has capacity to make this decision, she understood our counseling, and ultimately decided to pursue medical therapy alone. She asked to be discharged to home, we will treat her for 7 days with Augmentin and she will follow up with her PCP in the office within one week.   Microcytic anemia - patient was noted to be anemic on admission. She was given oral iron supplementation on discharge.   Hypokalemia - noted to be hypokalemic in the ED. Potassium was repleted.  No notes on file   Discharge Subjective: Patient reporting that she feels better since receiving antibiotics. Pain improved and reports that swelling has started to improve.   Discharge Exam:   BP 125/69 (BP Location: Left Arm)    Pulse 85    Temp 98.5 F (36.9 C) (Oral)  Resp 16    Ht 5\' 4"  (1.626 m)    Wt 130 kg    SpO2 100%    BMI 49.19 kg/m  Constitutional:      General: She is not in acute distress.    Appearance: She is well-developed. She is obese. She is not ill-appearing, toxic-appearing or diaphoretic.  HENT:     Head: Normocephalic and atraumatic.     Nose: No rhinorrhea.     Mouth/Throat:     Mouth: Mucous membranes are moist. No oral lesions.     Pharynx: Oropharynx is clear. No oropharyngeal exudate, posterior oropharyngeal erythema or uvula  swelling.     Tonsils: No tonsillar exudate or tonsillar abscesses.     Comments: Poor dentition, firm nodule left jaw, tender to palpation, airway clear Cardiovascular:     Rate and Rhythm: Normal rate and regular rhythm.  Pulmonary:     Effort: Pulmonary effort is normal. No respiratory distress.     Breath sounds: Normal breath sounds. No wheezing or rales.  Abdominal:     General: Bowel sounds are normal.     Palpations: Abdomen is soft.  Musculoskeletal:     Cervical back: Normal range of motion and neck supple.  Skin:    General: Skin is warm and dry.     Coloration: Skin is not pale.     Findings: No erythema or rash.  Neurological:     General: No focal deficit present.     Mental Status: She is alert and oriented to person, place, and time.  Psychiatric:        Mood and Affect: Mood normal.        Behavior: Behavior normal.   Pertinent Labs, Studies, and Procedures:  CBC Latest Ref Rng & Units 09/04/2021 09/03/2021 12/29/2020  WBC 4.0 - 10.5 K/uL 13.9(H) 18.2(H) 14.4(H)  Hemoglobin 12.0 - 15.0 g/dL 8.1(L) 9.3(L) 10.2(L)  Hematocrit 36.0 - 46.0 % 28.4(L) 34.0(L) 34.8(L)  Platelets 150 - 400 K/uL 340 436(H) 394    CMP Latest Ref Rng & Units 09/04/2021 09/03/2021 12/29/2020  Glucose 70 - 99 mg/dL 98 123(H) 124(H)  BUN 6 - 20 mg/dL 6 6 12   Creatinine 0.44 - 1.00 mg/dL 0.76 0.74 0.75  Sodium 135 - 145 mmol/L 133(L) 136 136  Potassium 3.5 - 5.1 mmol/L 2.9(L) 3.3(L) 3.5  Chloride 98 - 111 mmol/L 97(L) 96(L) 100  CO2 22 - 32 mmol/L 27 27 23   Calcium 8.9 - 10.3 mg/dL 8.7(L) 9.4 9.1  Total Protein 6.5 - 8.1 g/dL - - 8.6(H)  Total Bilirubin 0.3 - 1.2 mg/dL - - 1.1  Alkaline Phos 38 - 126 U/L - - 95  AST 15 - 41 U/L - - 26  ALT 0 - 44 U/L - - 27    DG Orthopantogram  Result Date: 09/04/2021 CLINICAL DATA:  Cellulitis, abscess. EXAM: ORTHOPANTOGRAM/PANORAMIC COMPARISON:  None. FINDINGS: No fracture is noted. Poor dentition is noted posteriorly in the left maxillary and  mandibular regions. There is noted lucency around the root of the left posterior molar in the mandible which may represent infection. IMPRESSION: No fracture is noted. Lucency noted around the root of the left posterior molar and mandible which may represent infection. Electronically Signed   By: Marijo Conception M.D.   On: 09/04/2021 08:09   CT Soft Tissue Neck W Contrast  Result Date: 09/03/2021 CLINICAL DATA:  Neck mass, nonpulsatile Swelling to submandibular space EXAM: CT NECK WITH CONTRAST TECHNIQUE: Multidetector CT imaging of  the neck was performed using the standard protocol following the bolus administration of intravenous contrast. CONTRAST:  74mL OMNIPAQUE IOHEXOL 300 MG/ML  SOLN COMPARISON:  None. FINDINGS: Pharynx and larynx: No evidence of a mass. Salivary glands: Edema surrounding the left submandibular gland from the process described below. Otherwise, the submandibular and parotid glands are unremarkable. Thyroid: Normal. Lymph nodes: Enlarged left submandibular and left greater than right upper cervical chain nodes. Vascular: Limited evaluation due to non arterial timing with major arteries in the neck appearing grossly patent. Limited intracranial: Unremarkable. Visualized orbits: Negative. Mastoids and visualized paranasal sinuses: Mild mucosal thickening of the inferior maxillary sinuses. Trace left mastoid fluid. Skeleton: No acute abnormality. Upper chest: Visualized lung apices are clear. Other: Edema and soft tissue thickening along the inferior aspect of the left mandibular body with approximately 2.3 x 2.3 by 1.4 cm fluid collection with peripheral enhancement, compatible with abscess. There is a low-attenuation fluid tract extending superiorly to the lingual aspect of the left mandibular body with periapical lucency and lingual cortical breakthrough of the posterior-most left mandibular molar in this region (series 5, images 35/36). Edema/cellulitis extends inferiorly along the  platysma into the upper neck and posteriorly into the submandibular space. IMPRESSION: 1. Findings compatible with cellulitis/phlegmon centered along the inferior aspect of the left mandibular body with 2.3 cm abscess in this region. Findings are likely odontogenic in etiology given tubular fluid tract extending superiorly to the posterior-most left mandibular molar with overlying periapical lucency and lingual cortical breakthrough. 2. Enlarged left submandibular and left greater than right upper cervical chain nodes, nonspecific but likely reactive given the above findings. Electronically Signed   By: Margaretha Sheffield M.D.   On: 09/03/2021 16:54     Discharge Instructions: Discharge Instructions     Call MD for:  difficulty breathing, headache or visual disturbances   Complete by: As directed    Call MD for:  extreme fatigue   Complete by: As directed    Call MD for:  hives   Complete by: As directed    Call MD for:  persistant dizziness or light-headedness   Complete by: As directed    Call MD for:  persistant nausea and vomiting   Complete by: As directed    Call MD for:  redness, tenderness, or signs of infection (pain, swelling, redness, odor or green/yellow discharge around incision site)   Complete by: As directed    Call MD for:  severe uncontrolled pain   Complete by: As directed    Call MD for:  temperature >100.4   Complete by: As directed    Diet - low sodium heart healthy   Complete by: As directed    Increase activity slowly   Complete by: As directed      We evaluated you in the hospital for an abscess related to your teeth. We would like to drain the abscess to help ensure resolution of the infection. However, if you are unwilling to stay in the hospital, we would like for you to take an antibiotic to help with the infection once we discharge you. The antibiotic is called Augmentin, please take one tablet twice daily for seven days. We also noted that you are anemic and  iron deficient. We will give you some iron supplementation to help improve your anemia. Please follow up with a primary care provider to ensure improvement in your infection and anemia. We have organized an appointment with [facility] January 30th at 2:30pm to be  seen.  If your infection does not improve, or worsens, please return to the emergency department. If you begin to notice worsening trouble swallowing, chest pain, or difficulty breathing, please return to the ED as well.   Signed: Delene Ruffini, MD 09/04/2021, 4:28 PM   Pager: [phone]

## 2021-09-04 NOTE — H&P (Signed)
H&P Infection  Exam Date: 09/04/21  ID: The patient is a 20 yoF who presented with pain and swelling of the left jaw  History of Present Illness:  The patient reports having pain and swelling that began a few days ago.  The patient was referred by ED physicians for evaluation.  The patient reports pain with swallowing and trismus as well as fevers. Denies dyspnea/dysphagia.  The patient has been been started on Unasyn. WBC 18. Currently afebrile.   At bedside discussed with patient that since starting the antibiotics she is feeling better and is able to swallow and eat without difficulty. She reports that she does not want to have surgery and wants to be treated only with antibiotics.    Clinical Exam: Extraoral Exam:              Patient is alert, orientated and in mild distress             CN II-XII grossly intact             There is appreciable facial swelling on the left side of the face.              The swelling extends into the neck region (left submandibular/submental)    Intraoral Exam:              The patient does have trismus with maximum incisal opening of 20 mm.             The left mandibular vestibule is raised.             The floor of mouth is non-elevated             The tongue is not elevated.             There is not lateral pharyngeal swelling with the uvula midline             There is no palatal draping present.             Oral airway is patent             Gross decay noted on throughout dentition     Radiographic Exam:               Panorex shows generalized caries with periapical radiolucencies associated with #18.                          A CT of the larynx with contrast was obtained showing fluid collections/cellulitis associated with the left submandibular and sublingual spaces extending into the left masticator space. There is mild airway deviation. Periapical pathology associated with tooth #18.   Assessment: 84 yoF patient with a left submandibular,  sublingual space infection currently responding to antimicrobial therapy.    Plan:    -Explained to patient that her dental decay was the root cause of her infection and though she is currently improving with antibiotics alone that this treatment will not address the cause of her infection and she will continue to have issues in the future until it is addressed -Further explained to the patient the infection has spread into the deeper fascial spaces of the neck and I recommend I&D extraorally and intraorally to drain her abscess however she does not want to undergo this -Patient is refusing any sort of surgical treatment so I will sign off; recommend continue Unasyn and transition to Augmentin on discharge -I do not plan to  follow up with this patient, she is advised to follow up locally should she be interested in having definitive care     Herbie Saxon, DDS Oral and Maxillofacial Surgeon [facility] [place]) Office # [phone] Cell # [phone]

## 2021-09-04 NOTE — Progress Notes (Signed)
Paged by RN for patient refusing surgical I&D and would like to go home. I spoke with patient who states that her pain and swallowing are much improved with antibiotics. Dr. Ross Marcus in room discussed with her even though antibiotics have improved her symptoms her infection will not improve without surgical drainage. Discussed that without surgical drainage her infection could get worse, she may develop sepsis, endocarditis, respiratory compromise, death, or other complications. Patient understands and states she would like to avoid the risk of surgery and would like to leave against medical advice as she is currently feeling well. Discussed we will giver her a 1 week course of antibiotics and have her follow up with [facility]. She is agreeable to the plan.

## 2021-09-04 NOTE — Progress Notes (Signed)
Pt. Refusing PIV at this time. Said she has been "stuck a lot". She stated "maybe later". Informed RN to let us know if pt. Changes mind.

## 2021-09-04 NOTE — Progress Notes (Incomplete)
HD#0 SUBJECTIVE:  Patient Summary: Roberta Bowman is a 35 y.o. with a pertinent PMH of ***, who presented with *** and admitted for ***.   Overnight Events: ***    Interm History: ***  OBJECTIVE:  Vital Signs: Vitals:   09/03/21 1830 09/03/21 2015 09/04/21 0001 09/04/21 0421  BP: 116/83 139/81 (!) 144/68 111/61  Pulse: 93 (!) 106 99 84  Resp:  18 18 17   Temp:  98.7 F (37.1 C) (!) 97.5 F (36.4 C) 98.5 F (36.9 C)  TempSrc:  Oral Oral Oral  SpO2: 100% 99% 96% 100%  Weight:      Height:       Supplemental O2: {NAMES:3044014::"Room Air","Nasal Cannula","Simple Face Mask","Partial Rebreather","HFNC","Non Rebreather","Venturi Mask","Bag Valve Mask"} SpO2: 100 %  Filed Weights   09/03/21 1544  Weight: 130 kg     Intake/Output Summary (Last 24 hours) at 09/04/2021 0746 Last data filed at 09/04/2021 0300 Gross per 24 hour  Intake 938.67 ml  Output --  Net 938.67 ml   Net IO Since Admission: 938.67 mL [09/04/21 0746]  Physical Exam: Physical Exam  Patient Lines/Drains/Airways Status     Active Line/Drains/Airways     Name Placement date Placement time Site Days   Peripheral IV 09/03/21 22 G Left Antecubital 09/03/21  1540  Antecubital  1            Pertinent Labs: CBC Latest Ref Rng & Units 09/03/2021 12/29/2020  WBC 4.0 - 10.5 K/uL 18.2(H) 14.4(H)  Hemoglobin 12.0 - 15.0 g/dL 9.3(L) 10.2(L)  Hematocrit 36.0 - 46.0 % 34.0(L) 34.8(L)  Platelets 150 - 400 K/uL 436(H) 394    CMP Latest Ref Rng & Units 09/03/2021 12/29/2020  Glucose 70 - 99 mg/dL 123(H) 124(H)  BUN 6 - 20 mg/dL 6 12  Creatinine 0.44 - 1.00 mg/dL 0.74 0.75  Sodium 135 - 145 mmol/L 136 136  Potassium 3.5 - 5.1 mmol/L 3.3(L) 3.5  Chloride 98 - 111 mmol/L 96(L) 100  CO2 22 - 32 mmol/L 27 23  Calcium 8.9 - 10.3 mg/dL 9.4 9.1  Total Protein 6.5 - 8.1 g/dL - 8.6(H)  Total Bilirubin 0.3 - 1.2 mg/dL - 1.1  Alkaline Phos 38 - 126 U/L - 95  AST 15 - 41 U/L - 26  ALT 0 - 44 U/L - 27    No  results for input(s): GLUCAP in the last 72 hours.   Pertinent Imaging: CT Soft Tissue Neck W Contrast  Result Date: 09/03/2021 CLINICAL DATA:  Neck mass, nonpulsatile Swelling to submandibular space EXAM: CT NECK WITH CONTRAST TECHNIQUE: Multidetector CT imaging of the neck was performed using the standard protocol following the bolus administration of intravenous contrast. CONTRAST:  58mL OMNIPAQUE IOHEXOL 300 MG/ML  SOLN COMPARISON:  None. FINDINGS: Pharynx and larynx: No evidence of a mass. Salivary glands: Edema surrounding the left submandibular gland from the process described below. Otherwise, the submandibular and parotid glands are unremarkable. Thyroid: Normal. Lymph nodes: Enlarged left submandibular and left greater than right upper cervical chain nodes. Vascular: Limited evaluation due to non arterial timing with major arteries in the neck appearing grossly patent. Limited intracranial: Unremarkable. Visualized orbits: Negative. Mastoids and visualized paranasal sinuses: Mild mucosal thickening of the inferior maxillary sinuses. Trace left mastoid fluid. Skeleton: No acute abnormality. Upper chest: Visualized lung apices are clear. Other: Edema and soft tissue thickening along the inferior aspect of the left mandibular body with approximately 2.3 x 2.3 by 1.4 cm fluid collection with peripheral enhancement, compatible with  abscess. There is a low-attenuation fluid tract extending superiorly to the lingual aspect of the left mandibular body with periapical lucency and lingual cortical breakthrough of the posterior-most left mandibular molar in this region (series 5, images 35/36). Edema/cellulitis extends inferiorly along the platysma into the upper neck and posteriorly into the submandibular space. IMPRESSION: 1. Findings compatible with cellulitis/phlegmon centered along the inferior aspect of the left mandibular body with 2.3 cm abscess in this region. Findings are likely odontogenic in  etiology given tubular fluid tract extending superiorly to the posterior-most left mandibular molar with overlying periapical lucency and lingual cortical breakthrough. 2. Enlarged left submandibular and left greater than right upper cervical chain nodes, nonspecific but likely reactive given the above findings. Electronically Signed   By: Feliberto Harts M.D.   On: 09/03/2021 16:54    ASSESSMENT/PLAN:  Assessment: Principal Problem:   Dental abscess   Roberta Bowman is a 35 y.o. with pertinent PMH of *** who presented with *** and admit for *** on hospital day 0  Plan: # # Odontogenic abscess, likely secondary to poor dentition/oral hygiene - CT head showing cellulitis/phlegmon along inferior aspect of left mandibular body with 2.3cm abscess.  - patient able to speak in full sentences, able to clear oral secretions. Able to swallow liquids but painful swallowing of solids. No chest pain. wil monitor with cotinuous pulse ox due to slight airway deviation.  - receiving unasyn 3g q6hr. - toradol 30mg  for pain - NPO for now, SLP consulted - continue to monitor with daily CBC and BMP -  - discussed with oral surgery, planning on surgery in AM 12/17.  #*** ***  Best Practice: Diet: {CHL DISCHARGE DIET:21201} IVF: Fluids: {Meds; iv fluids:31617}, Rate: {NAMES:3044014::"None","*** cc/hr x *** hrs","*** cc bolus"} VTE:  Code: {NAMES:3044014::"Full","DNR","DNI","DNR/DNI","Comfort Care","Unknown"} AB: *** Therapy Recs: {NAMES:3044014::"None","Pending","CIR","SNF","ALF","LTAC","Home Health"}, DME: {Assistive Devices 1/18 Family Contact: ***, {Family:304960261::"to be notified."} DISPO: Anticipated discharge {NAMES:3044014::"today","tomorrow","in *** days"} to {Discharge Destination:18313::"Home"} pending {BARRIERS TO DISCHARGE:24135}.  Signature: [id number]}, MD  Internal Medicine Resident, PGY-1 [facility]  Pager: [phone] 7:46 AM, 09/04/2021    Please contact the on call pager after 5 pm and on weekends at [phone].

## 2021-09-04 NOTE — TOC Initial Note (Signed)
Transition of Care [facility]) - Initial/Assessment Note    Patient Details  Name: Roberta Bowman MRN: [id number] Date of Birth: 07-07-1986  Transition of Care [facility]) CM/SW Contact:    Kingsley Plan, RN Phone Number: 09/04/2021, 12:03 PM  Clinical Narrative:                 Spoke to patient at bedside. Confirmed face sheet information.   Patient does not have PCP and does not have insurance.   Patient interested in establishing care with [facility]. NCM called and scheduled appointment. Information placed on AVS  Discussed TOC pharmacy and assisting with discharge medications through [facility] program. Patient can afford [employer] co pays.   Provided patient with medicaid information , [place] resources and Hospital doctor [place] and left message.   TOC will continue to follow   Expected Discharge Plan: Home/Self Care     Patient Goals and CMS Choice Patient states their goals for this hospitalization and ongoing recovery are:: to return to home CMS Medicare.gov Compare Post Acute Care list provided to:: Patient    Expected Discharge Plan and Services Expected Discharge Plan: Home/Self Care In-house Referral: Financial Counselor     Living arrangements for the past 2 months: Single Family Home                 DME Arranged: N/A DME Agency: NA       HH Arranged: NA          Prior Living Arrangements/Services Living arrangements for the past 2 months: Single Family Home Lives with:: Self   Do you feel safe going back to the place where you live?: Yes      Need for Family Participation in Patient Care: No (Comment) Care giver support system in place?: Yes (comment)   Criminal Activity/Legal Involvement Pertinent to Current Situation/Hospitalization: No - Comment as needed  Activities of Daily Living Home Assistive Devices/Equipment: None ADL Screening (condition at time of admission) Patient's cognitive ability  adequate to safely complete daily activities?: Yes Is the patient deaf or have difficulty hearing?: No Does the patient have difficulty seeing, even when wearing glasses/contacts?: No Does the patient have difficulty concentrating, remembering, or making decisions?: No Patient able to express need for assistance with ADLs?: Yes Does the patient have difficulty dressing or bathing?: No Independently performs ADLs?: Yes (appropriate for developmental age) Does the patient have difficulty walking or climbing stairs?: No Weakness of Legs: None Weakness of Arms/Hands: None  Permission Sought/Granted   Permission granted to share information with : No              Emotional Assessment Appearance:: Appears stated age Attitude/Demeanor/Rapport: Engaged Affect (typically observed): Accepting Orientation: : Oriented to Self, Oriented to Place, Oriented to  Time, Oriented to Situation Alcohol / Substance Use: Not Applicable Psych Involvement: No (comment)  Admission diagnosis:  Dental abscess [K04.7] Facial abscess [L02.01] Patient Active Problem List   Diagnosis Date Noted   Microcytic anemia 09/04/2021   Dental abscess 09/03/2021   PCP:  Patient, No Pcp Per (Inactive) Pharmacy:   [id number] [place], [place] - [address] [place] [address] Phone: [phone] Fax: [phone]  [facility] [address] [place] [address] Phone: [phone] Fax: [phone]     Social Determinants of Health (SDOH) Interventions    Readmission Risk Interventions No flowsheet data found.

## 2021-09-04 NOTE — Evaluation (Addendum)
Clinical/Bedside Swallow Evaluation Patient Details  Name: Roberta Bowman MRN: [id number] Date of Birth: 08-14-1986  Today's Date: 09/04/2021 Time: SLP Start Time (ACUTE ONLY): 6294 SLP Stop Time (ACUTE ONLY): 0947 SLP Time Calculation (min) (ACUTE ONLY): 10 min  Past Medical History: History reviewed. No pertinent past medical history. Past Surgical History: History reviewed. No pertinent surgical history. HPI:  Roberta Bowman is a 35 year old person with history of dental carries who presents for swelling of the left jaw starting around 12/12.  Notes associated pain with swallowing. Notes that she can swallow fluids easily but feels that solids get stuck in throat and she has to cough food back up. Also notes some difficulty with breathing when laying on her right side.  CT 12/15 revealed : "1. Findings compatible with cellulitis/phlegmon centered along the inferior aspect of the left mandibular body with 2.3 cm abscess in this region. Findings are likely odontogenic in etiology given tubular fluid tract extending superiorly to the posterior-most left mandibular molar with overlying periapical lucency and lingual cortical breakthrough.  2. Enlarged left submandibular and left greater than right upper  cervical chain nodes, nonspecific but likely reactive given the above findings."  Pt with no prior hx on file    Assessment / Plan / Recommendation  Clinical Impression  Pt presents with functional swallowing as assessed clinically.  Pt did not exhibite any clinical s/s of aspiration with any consistencies trialed.  Pt exhibited good oral clearance of solids.  Pt reports odynophagia has resolved.  She feels swelling has gone down with IV abx and does not feel she is having difficulty getting any solids trialed down today.  On OME oropharynx appeared slightly red and possibly edematous. Facial swelling on L side still noted as well.  Recommend regular texture diet with thin liquids.  Counseled pt to  choose softer foods as needed until swelling fully resolves.  Pt has no further ST needs at this time.  SLP will sign off.   SLP Visit Diagnosis: Dysphagia, unspecified (R13.10)    Aspiration Risk  No limitations    Diet Recommendation Regular;Thin liquid   Liquid Administration via: Cup;Straw Medication Administration: Whole meds with liquid Supervision: Patient able to self feed    Other  Recommendations Oral Care Recommendations: Oral care BID    Recommendations for follow up therapy are one component of a multi-disciplinary discharge planning process, led by the attending physician.  Recommendations may be updated based on patient status, additional functional criteria and insurance authorization.  Follow up Recommendations No SLP follow up      Assistance Recommended at Discharge None  Functional Status Assessment Patient has not had a recent decline in their functional status  Frequency and Duration  (N/A)          Prognosis Prognosis for Safe Diet Advancement:  (N/A)      Swallow Study   General Date of Onset: 09/03/21 HPI: Roberta Bowman is a 34 year old person with history of dental carries who presents for swelling of the left jaw starting around 12/12.  Notes associated pain with swallowing. Notes that she can swallow fluids easily but feels that solids get stuck in throat and she has to cough food back up. Also notes some difficulty with breathing when laying on her right side.  CT 12/15 revealed : "1. Findings compatible with cellulitis/phlegmon centered along the inferior aspect of the left mandibular body with 2.3 cm abscess in this region. Findings are likely odontogenic in etiology given tubular  fluid tract extending superiorly to the posterior-most left mandibular molar with overlying periapical lucency and lingual cortical breakthrough.  2. Enlarged left submandibular and left greater than right upper  cervical chain nodes, nonspecific but likely reactive given  the above findings."  Pt with no prior hx on file Type of Study: Bedside Swallow Evaluation Previous Swallow Assessment: None Diet Prior to this Study: NPO Temperature Spikes Noted: No Respiratory Status: Room air History of Recent Intubation: No Behavior/Cognition: Alert;Cooperative;Pleasant mood Oral Cavity Assessment: Within Functional Limits Oral Care Completed by SLP: No Oral Cavity - Dentition: Poor condition Vision: Functional for self-feeding Self-Feeding Abilities: Able to feed self Patient Positioning: Upright in bed Baseline Vocal Quality: Normal Volitional Cough: Strong Volitional Swallow: Able to elicit    Oral/Motor/Sensory Function Overall Oral Motor/Sensory Function: Within functional limits Facial ROM: Within Functional Limits Facial Symmetry: Within Functional Limits Lingual ROM: Within Functional Limits Lingual Symmetry: Within Functional Limits Lingual Strength: Within Functional Limits Velum: Within Functional Limits (edema?) Mandible: Impaired (reduced opening)   Ice Chips Ice chips: Not tested   Thin Liquid Thin Liquid: Within functional limits Presentation: Straw    Nectar Thick Nectar Thick Liquid: Not tested   Honey Thick Honey Thick Liquid: Not tested   Puree Puree: Within functional limits Presentation: Spoon   Solid     Solid: Within functional limits Presentation: Self Fed;Spoon      Kerrie Pleasure, MA, CCC-SLP Acute Rehabilitation Services Office: [phone] 09/04/2021,9:59 AM

## 2021-09-04 NOTE — Progress Notes (Signed)
Patient ID: Roberta Bowman, female   DOB: November 19, 1985, 35 y.o.   MRN: [id number]  Patient requested IV removal due to pain at site. IV removed. Order placed for IV consult. Patient declined at this time. Patient declined scheduled antibiotics. Education provided and MD made aware. Will continue to monitor.  Patient requested additional information about tomorrows surgical procedure and access to scans. MD notified and will address with patient.  Lidia Collum, RN

## 2021-09-04 NOTE — Discharge Instructions (Addendum)
Dear Mrs,  We evaluated you in the hospital for an abscess related to your teeth. We would like to drain the abscess to help ensure resolution of the infection. However, if you are unwilling to stay in the hospital, we would like for you to take an antibiotic to help with the infection once we discharge you. The antibiotic is called Augmentin, please take one tablet twice daily for seven days. We also noted that you are anemic and iron deficient. We will give you some iron supplementation to help improve your anemia. Please follow up with a primary care provider to ensure improvement in your infection and anemia. We have organized an appointment with [facility] January 30th at 2:30pm to be seen.  If your infection does not improve, or worsens, please return to the emergency department. If you begin to notice worsening trouble swallowing, chest pain, or difficulty breathing, please return to the ED as well.

## 2021-09-05 SURGERY — DENTAL RESTORATION/EXTRACTIONS
Anesthesia: General

## 2021-10-09 ENCOUNTER — Emergency Department (HOSPITAL_COMMUNITY): Payer: Self-pay

## 2021-10-09 ENCOUNTER — Encounter (HOSPITAL_COMMUNITY): Payer: Self-pay | Admitting: Emergency Medicine

## 2021-10-09 ENCOUNTER — Inpatient Hospital Stay (HOSPITAL_COMMUNITY)
Admission: EM | Admit: 2021-10-09 | Discharge: 2021-10-11 | DRG: 872 | Disposition: A | Discharge status: Home / Self Care | Payer: Self-pay | Attending: Family Medicine | Admitting: Family Medicine

## 2021-10-09 DIAGNOSIS — Z6841 Body Mass Index (BMI) 40.0 and over, adult: Secondary | ICD-10-CM

## 2021-10-09 DIAGNOSIS — E669 Obesity, unspecified: Secondary | ICD-10-CM | POA: Diagnosis present

## 2021-10-09 DIAGNOSIS — Z20822 Contact with and (suspected) exposure to covid-19: Secondary | ICD-10-CM | POA: Diagnosis present

## 2021-10-09 DIAGNOSIS — Z79899 Other long term (current) drug therapy: Secondary | ICD-10-CM

## 2021-10-09 DIAGNOSIS — K047 Periapical abscess without sinus: Secondary | ICD-10-CM | POA: Diagnosis present

## 2021-10-09 DIAGNOSIS — K029 Dental caries, unspecified: Secondary | ICD-10-CM | POA: Diagnosis present

## 2021-10-09 DIAGNOSIS — K122 Cellulitis and abscess of mouth: Secondary | ICD-10-CM

## 2021-10-09 DIAGNOSIS — A419 Sepsis, unspecified organism: Principal | ICD-10-CM | POA: Diagnosis present

## 2021-10-09 DIAGNOSIS — D509 Iron deficiency anemia, unspecified: Secondary | ICD-10-CM | POA: Diagnosis present

## 2021-10-09 LAB — CBC WITH DIFFERENTIAL/PLATELET
Abs Immature Granulocytes: 0.16 10*3/uL — ABNORMAL HIGH (ref 0.00–0.07)
Basophils Absolute: 0 10*3/uL (ref 0.0–0.1)
Basophils Relative: 0 %
Eosinophils Absolute: 0.1 10*3/uL (ref 0.0–0.5)
Eosinophils Relative: 1 %
HCT: 36.5 % (ref 36.0–46.0)
Hemoglobin: 9.8 g/dL — ABNORMAL LOW (ref 12.0–15.0)
Immature Granulocytes: 1 %
Lymphocytes Relative: 16 %
Lymphs Abs: 2.6 10*3/uL (ref 0.7–4.0)
MCH: 18.8 pg — ABNORMAL LOW (ref 26.0–34.0)
MCHC: 26.8 g/dL — ABNORMAL LOW (ref 30.0–36.0)
MCV: 69.9 fL — ABNORMAL LOW (ref 80.0–100.0)
Monocytes Absolute: 1 10*3/uL (ref 0.1–1.0)
Monocytes Relative: 6 %
Neutro Abs: 12.5 10*3/uL — ABNORMAL HIGH (ref 1.7–7.7)
Neutrophils Relative %: 76 %
Platelets: 504 10*3/uL — ABNORMAL HIGH (ref 150–400)
RBC: 5.22 MIL/uL — ABNORMAL HIGH (ref 3.87–5.11)
RDW: 17.3 % — ABNORMAL HIGH (ref 11.5–15.5)
WBC: 16.4 10*3/uL — ABNORMAL HIGH (ref 4.0–10.5)
nRBC: 0 % (ref 0.0–0.2)

## 2021-10-09 LAB — RESP PANEL BY RT-PCR (FLU A&B, COVID) ARPGX2
Influenza A by PCR: NEGATIVE
Influenza B by PCR: NEGATIVE
SARS Coronavirus 2 by RT PCR: NEGATIVE

## 2021-10-09 LAB — COMPREHENSIVE METABOLIC PANEL
ALT: 18 U/L (ref 0–44)
AST: 21 U/L (ref 15–41)
Albumin: 3.7 g/dL (ref 3.5–5.0)
Alkaline Phosphatase: 106 U/L (ref 38–126)
Anion gap: 12 (ref 5–15)
BUN: 5 mg/dL — ABNORMAL LOW (ref 6–20)
CO2: 24 mmol/L (ref 22–32)
Calcium: 9.2 mg/dL (ref 8.9–10.3)
Chloride: 98 mmol/L (ref 98–111)
Creatinine, Ser: 0.8 mg/dL (ref 0.44–1.00)
GFR, Estimated: >60 mL/min (ref >60)
Glucose, Bld: 114 mg/dL — ABNORMAL HIGH (ref 70–99)
Potassium: 3.6 mmol/L (ref 3.5–5.1)
Sodium: 134 mmol/L — ABNORMAL LOW (ref 135–145)
Total Bilirubin: 1.5 mg/dL — ABNORMAL HIGH (ref 0.3–1.2)
Total Protein: 9 g/dL — ABNORMAL HIGH (ref 6.5–8.1)

## 2021-10-09 LAB — LACTIC ACID, PLASMA
Lactic Acid, Venous: 2.7 mmol/L (ref 0.5–1.9)
Lactic Acid, Venous: 2.1 mmol/L (ref 0.5–1.9)

## 2021-10-09 LAB — I-STAT BETA HCG BLOOD, ED (MC, WL, AP ONLY): I-stat hCG, quantitative: <5 m[IU]/mL (ref <5)

## 2021-10-09 MED ORDER — SODIUM CHLORIDE 0.9 % IV SOLN
3.0000 g | Freq: Four times a day (QID) | INTRAVENOUS | Status: DC
  Administered 2021-10-09 – 2021-10-11 (×7): 3 g via INTRAVENOUS
  Filled 2021-10-09 (×7): qty 8

## 2021-10-09 MED ORDER — ONDANSETRON HCL 4 MG PO TABS: 4.0000 mg | ORAL_TABLET | Freq: Four times a day (QID) | ORAL | Status: DC | PRN

## 2021-10-09 MED ORDER — FENTANYL CITRATE PF 50 MCG/ML IJ SOSY
25.0000 ug | PREFILLED_SYRINGE | Freq: Once | INTRAMUSCULAR | Status: AC
  Administered 2021-10-09: 25 ug via INTRAVENOUS
  Filled 2021-10-09: qty 1

## 2021-10-09 MED ORDER — LACTATED RINGERS IV SOLN: INTRAVENOUS | Status: DC

## 2021-10-09 MED ORDER — OXYCODONE-ACETAMINOPHEN 5-325 MG PO TABS
1.0000 | ORAL_TABLET | ORAL | Status: DC | PRN
  Administered 2021-10-09 – 2021-10-10 (×2): 1 via ORAL
  Filled 2021-10-09 (×2): qty 1

## 2021-10-09 MED ORDER — LACTATED RINGERS IV BOLUS
500.0000 mL | Freq: Once | INTRAVENOUS | Status: AC
  Administered 2021-10-09: 500 mL via INTRAVENOUS

## 2021-10-09 MED ORDER — BENZOCAINE 10 % MT GEL: Freq: Four times a day (QID) | OROMUCOSAL | Status: DC | PRN

## 2021-10-09 MED ORDER — ACETAMINOPHEN 650 MG RE SUPP: 650.0000 mg | Freq: Four times a day (QID) | RECTAL | Status: DC | PRN

## 2021-10-09 MED ORDER — ACETAMINOPHEN 325 MG PO TABS: 650.0000 mg | ORAL_TABLET | Freq: Four times a day (QID) | ORAL | Status: DC | PRN

## 2021-10-09 MED ORDER — ONDANSETRON HCL 4 MG/2ML IJ SOLN: 4.0000 mg | Freq: Four times a day (QID) | INTRAMUSCULAR | Status: DC | PRN

## 2021-10-09 MED ORDER — IOHEXOL 350 MG/ML SOLN
75.0000 mL | Freq: Once | INTRAVENOUS | Status: AC | PRN
  Administered 2021-10-09: 75 mL via INTRAVENOUS

## 2021-10-09 MED ORDER — SODIUM CHLORIDE 0.9 % IV SOLN
1.5000 g | Freq: Once | INTRAVENOUS | Status: AC
  Administered 2021-10-09: 1.5 g via INTRAVENOUS
  Filled 2021-10-09: qty 4

## 2021-10-09 NOTE — ED Notes (Signed)
New iv placed  back to c-t

## 2021-10-09 NOTE — Progress Notes (Signed)
Pharmacy Antibiotic Note  Roberta Bowman is a 36 y.o. female admitted on 10/09/2021 presenting with dental pain, concern for dental infection.  Pharmacy has been consulted for Unasyn dosing.  Plan: Unasyn 3g IV q 6 hours Monitor renal function, clinical progression and LOT     Temp (24hrs), Avg:98.6 F (37 C), Min:98.1 F (36.7 C), Max:98.9 F (37.2 C)  Recent Labs  Lab 10/09/21 1327 10/09/21 1340 10/09/21 1628  WBC 16.4*  --   --   CREATININE 0.80  --   --   LATICACIDVEN  --  2.7* 2.1*    CrCl cannot be calculated (Unknown ideal weight.).    No Known Allergies  Daylene Posey, PharmD Clinical Pharmacist ED Pharmacist Phone # [phone] 10/09/2021 8:56 PM

## 2021-10-09 NOTE — ED Triage Notes (Signed)
Patient here for evaluation of left lower dental pain that started a few days ago. Patient denies fevers and chills, states pain is exacerbated by chewing. Patient is alert, oriented, and in no apparent distress at this time.

## 2021-10-09 NOTE — H&P (Addendum)
History and Physical    Roberta PlumberCrystal Bowman ZOX:[id number] DOB: 06-02-1986 DOA: 10/09/2021  PCP: Patient, No Pcp Per (Inactive)  Patient coming from: Home  I have personally briefly reviewed patient[facility]  Chief Complaint: Dental pain / infection  HPI: Roberta PlumberCrystal Bowman is a 36 y.o. female with medical history significant of Obesity, multiple dental caries.  Pt with poor dentition chronically.  Pt recently admitted in Dec for oral abscess related to poor dentition.  Started on ABx.  I+D recd at that time but pt refused surgery and left AMA after she started feeling better with ABx.  Completed course of outpt Augmentin.  Initially doing better, she presents to the ED for worsening L bottom dental pain for past 3-4 days.  Denies fever, sore throat, CP, SOB.  No trouble swallowing, does have pain with chewing.   ED Course: CT reveals improvement in overall infectious picture: specifically abscess seen previously has resolved.  WBC 16k, initial lactate 2.7, initial tachycardia.  EDP concerned for sepsis: pt started on IVF and unasyn.   History reviewed. No pertinent past medical history.  No past surgical history on file.   reports that she has never smoked. She has never used smokeless tobacco. She reports current alcohol use. She reports that she does not currently use drugs.  No Known Allergies  No family history on file.  Prior to Admission medications   Medication Sig Start Date End Date Taking? Authorizing Provider  Ferrous Sulfate 28 MG TABS Take 1 tablet (28 mg total) by mouth daily. 09/04/21   Adron BeneGawaluck, [place], MD    Physical Exam: Vitals:   10/09/21 1853 10/09/21 1900 10/09/21 2000 10/09/21 2032  BP:  139/89 127/78   Pulse: 81 96 81   Resp: 18 18  17   Temp: 98.9 F (37.2 C)     TempSrc:      SpO2: 100% 100% 100%     Constitutional: NAD, calm, comfortable Eyes: PERRL, lids and conjunctivae normal ENMT: Bad dentition with  serous drainage to gumline of left lower back teeth.  Trismus present. Neck: normal, supple, no masses, no thyromegaly Respiratory: clear to auscultation bilaterally, no wheezing, no crackles. Normal respiratory effort. No accessory muscle use.  Cardiovascular: Regular rate and rhythm, no murmurs / rubs / gallops. No extremity edema. 2+ pedal pulses. No carotid bruits.  Abdomen: no tenderness, no masses palpated. No hepatosplenomegaly. Bowel sounds positive.  Musculoskeletal: no clubbing / cyanosis. No joint deformity upper and lower extremities. Good ROM, no contractures. Normal muscle tone.  Skin: no rashes, lesions, ulcers. No induration Neurologic: CN 2-12 grossly intact. Sensation intact, DTR normal. Strength 5/5 in all 4.  Psychiatric: Normal judgment and insight. Alert and oriented x 3. Normal mood.    Labs on Admission: I have personally reviewed following labs and imaging studies  CBC: Recent Labs  Lab 10/09/21 1327  WBC 16.4*  NEUTROABS 12.5*  HGB 9.8*  HCT 36.5  MCV 69.9*  PLT 504*   Basic Metabolic Panel: Recent Labs  Lab 10/09/21 1327  NA 134*  K 3.6  CL 98  CO2 24  GLUCOSE 114*  BUN 5*  CREATININE 0.80  CALCIUM 9.2   GFR: CrCl cannot be calculated (Unknown ideal weight.). Liver Function Tests: Recent Labs  Lab 10/09/21 1327  AST 21  ALT 18  ALKPHOS 106  BILITOT 1.5*  PROT 9.0*  ALBUMIN 3.7   No results for input(s): LIPASE, AMYLASE in the last 168 hours. No results for  input(s): AMMONIA in the last 168 hours. Coagulation Profile: No results for input(s): INR, PROTIME in the last 168 hours. Cardiac Enzymes: No results for input(s): CKTOTAL, CKMB, CKMBINDEX, TROPONINI in the last 168 hours. BNP (last 3 results) No results for input(s): PROBNP in the last 8760 hours. HbA1C: No results for input(s): HGBA1C in the last 72 hours. CBG: No results for input(s): GLUCAP in the last 168 hours. Lipid Profile: No results for input(s): CHOL, HDL,  LDLCALC, TRIG, CHOLHDL, LDLDIRECT in the last 72 hours. Thyroid Function Tests: No results for input(s): TSH, T4TOTAL, FREET4, T3FREE, THYROIDAB in the last 72 hours. Anemia Panel: No results for input(s): VITAMINB12, FOLATE, FERRITIN, TIBC, IRON, RETICCTPCT in the last 72 hours. Urine analysis:    Component Value Date/Time   COLORURINE BROWN (A) 12/29/2020 1340   APPEARANCEUR HAZY (A) 12/29/2020 1340   LABSPEC <1.005 (L) 12/29/2020 1340   PHURINE 5.5 12/29/2020 1340   GLUCOSEU NEGATIVE 12/29/2020 1340   HGBUR LARGE (A) 12/29/2020 1340   BILIRUBINUR NEGATIVE 12/29/2020 1340   KETONESUR NEGATIVE 12/29/2020 1340   PROTEINUR 30 (A) 12/29/2020 1340   NITRITE NEGATIVE 12/29/2020 1340   LEUKOCYTESUR MODERATE (A) 12/29/2020 1340    Radiological Exams on Admission: CT Soft Tissue Neck W Contrast  Result Date: 10/09/2021 CLINICAL DATA:  Face swelling, infection, pain, prior dental abscess on the left EXAM: CT NECK WITH CONTRAST TECHNIQUE: Multidetector CT imaging of the neck was performed using the standard protocol following the bolus administration of intravenous contrast. RADIATION DOSE REDUCTION: This exam was performed according to the departmental dose-optimization program which includes automated exposure control, adjustment of the mA and/or kV according to patient size and/or use of iterative reconstruction technique. CONTRAST:  57mL OMNIPAQUE IOHEXOL 350 MG/ML SOLN COMPARISON:  09/03/2021 FINDINGS: Pharynx and larynx: No mass or swelling. Salivary glands: No inflammation, mass, or stone. Thyroid: Normal. Lymph nodes: Enlarged left submandibular and left upper cervical chain lymph nodes, which are stable to slightly decreased in size compared to the prior exam. For example a left level 2A lymph node measures up to 1.2 cm in short axis, previously 1.3 cm. A left level 1B lymph node measures 9 mm in short axis (series 3, image 62), previously 10 mm. Vascular: Negative. Limited intracranial:  Negative. Visualized orbits: Negative. Mastoids and visualized paranasal sinuses: Minimal mucosal thickening in the right maxillary sinus. Otherwise negative. The mastoids are well aerated. Skeleton: No acute osseous abnormality. Upper chest: Evaluation is somewhat limited by respiratory motion. No focal pulmonary opacity or pleural effusion. Other: Previously noted edema and fat stranding along the inferior aspect of the left mandibular body has significantly decreased, with only minimal fat stranding remaining (series 3, image 65). The previously noted abscess is no longer seen. Periapical lucency remains about the most posterior left mandibular molar (series 4, image 65), with redemonstrated cortical breakthrough (series 4, image 64). IMPRESSION: 1. Significant improvement in the appearance of the left submandibular tissues, with resolution of previously noted edema, fat stranding, in abscess along the inferior aspect of the left mandibular body. There is persistent periapical lucency about the most posterior left mandibular molar, which continues to show cortical breakthrough, but only mild stranding in the adjacent soft tissues. 2. Redemonstrated enlarged left submandibular and upper cervical chain lymph nodes, likely reactive. Electronically Signed   By: Wiliam Ke M.D.   On: 10/09/2021 18:43    EKG: Independently reviewed.  Assessment/Plan Principal Problem:   Oral infection Active Problems:   Microcytic hypochromic anemia  Dental infection   Sepsis (HCC)    Dental / Oral infection - with sepsis No abscess on todays CT Empiric unasyn IVF: 1L bolus in ED and LR at 125 BCx pending Call oral surgery in AM (likely needs multiple extractions at a minimum). Pt consents to surgery if needed this time around she says. Tylenol PRN Orajel PRN Percocet PRN if absolutely needed Will make NPO after MN Microcytic hypochromic anemia - Appears chronic Anemia pnl pending  DVT prophylaxis: SCDs  in case they want to do surgery Code Status: Full Family Communication: No family in room Disposition Plan: Home after sepsis resolved and oral surgery clears patient Consults called: None - call oral surgery in AM Admission status: Place in obs    Mayleigh Tetrault M. DO [facility]  How to contact the [facility] Attending or Consulting provider 7A - 7P or covering provider during after hours 7P -7A, for this patient?  Check the care team in [facility] and look for a) attending/consulting [facility] provider listed and b) the [facility] team listed Log into [email or web address]  Amion Physician Scheduling and messaging for groups and whole hospitals  On call and physician scheduling software for group practices, residents, hospitalists and other medical providers for call, clinic, rotation and shift schedules. On[employer] is a hospital-wide system for scheduling doctors and paging doctors on call. EasyPlot is for scientific plotting and data analysis.  [email or web address]  and use [place]'s universal password to access. If you do not have the password, please contact the hospital operator.  Locate the [facility] provider you are looking for under [facility] and page to a number that you can be directly reached. If you still have difficulty reaching the provider, please page the [facility] (Director on Call) for the Hospitalists listed on amion for assistance.  10/09/2021, 9:02 PM

## 2021-10-09 NOTE — ED Provider Notes (Signed)
[facility] EMERGENCY DEPARTMENT Provider Note   CSN: [id number] Arrival date & time: 10/09/21  1014     History Chief Complaint  Patient presents with   Dental Pain    Roberta Bowman is a 36 y.o. female presents to the ED for evaluation of worsening left bottom dental pain for the past 3-4 days. She denies any fevers, sore throat, chest pain, or SOB. She mentions that she is having some anterior neck pain as well. She denies any trouble swallowing, but mentions pain on chewing. The patient was recently admitted on 09/03/21 and was supposed to get the abscess drained and tooth extracted but the patient left AMA after IV antibiotics. Additionally, she mentions she has had multiple dental infections in the past. She denies any other medical or surgical history. Denies any daily medications. NKDA. Denies any tobacco, EtOH, or illicit drug use.    Dental Pain Associated symptoms: neck pain   Associated symptoms: no congestion, no facial swelling and no fever       Home Medications Prior to Admission medications   Medication Sig Start Date End Date Taking? Authorizing Provider  Ferrous Sulfate 28 MG TABS Take 1 tablet (28 mg total) by mouth daily. 09/04/21   Delene Ruffini, MD      Allergies    Patient has no known allergies.    Review of Systems   Review of Systems  Constitutional:  Negative for chills and fever.  HENT:  Positive for dental problem. Negative for congestion, facial swelling, rhinorrhea and trouble swallowing.   Respiratory:  Negative for shortness of breath.   Cardiovascular:  Negative for chest pain.  Musculoskeletal:  Positive for neck pain.   Physical Exam Updated Vital Signs BP (!) 156/93 (BP Location: Right Arm)    Pulse (!) 111    Temp 98.1 F (36.7 C) (Oral)    Resp 20    SpO2 97%  Physical Exam Vitals and nursing note reviewed.  Constitutional:      General: She is not in acute distress.    Appearance: She is not toxic-appearing.      Comments: Poor hygiene.  HENT:     Head: Normocephalic and atraumatic.     Nose: Nose normal.     Mouth/Throat:     Mouth: Mucous membranes are moist.     Pharynx: No oropharyngeal exudate or posterior oropharyngeal erythema.     Comments: Extensive poor dentition. [place], eroded teeth. Area of dark, serous drainage in the gumline of the left lower back teeth with more expressed on palpation with a Q tip. Tender to palpation. Uvula midline. No soft palate deformity. Trismus present. Unable to fit two fingers into mouth.  Eyes:     General: No scleral icterus. Neck:     Comments: Area of induration to the left lower mandible extending back to the ear. No overlying erythema or red streaking. Tender to palpation.  Cardiovascular:     Rate and Rhythm: Tachycardia present.  Pulmonary:     Effort: Pulmonary effort is normal. No respiratory distress.  Musculoskeletal:     Cervical back: Tenderness present.  Skin:    General: Skin is dry.     Findings: No rash.  Neurological:     General: No focal deficit present.     Mental Status: She is alert. Mental status is at baseline.  Psychiatric:        Mood and Affect: Mood normal.    ED Results / Procedures / Treatments  Labs (all labs ordered are listed, but only abnormal results are displayed) Labs Reviewed  CBC WITH DIFFERENTIAL/PLATELET  COMPREHENSIVE METABOLIC PANEL  LACTIC ACID, PLASMA  LACTIC ACID, PLASMA  I-STAT BETA HCG BLOOD, ED ([facility], [facility], [facility] ONLY)    EKG None  Radiology No results found.  Procedures Procedures   Medications Ordered in ED Medications - No data to display  ED Course/ Medical Decision Making/ A&P                           Medical Decision Making Amount and/or Complexity of Data Reviewed Labs: ordered. Radiology: ordered.  Risk Prescription drug management.   36 y/o F presents to the ED for evaluation of dental pain for the past few days. Differential diagnosis includes but it not limited  to dental abscess, deep space infection, Ludwig's angina. The patient was recently admitted and left AMA for this dental infection. For vital signs, the patient is tachycardic with mildly elevated blood pressure.  Physical exam is pertinent for poor hygiene, poor dentition, draining area in the left lower mandible with dark serous fluid.  Large area of induration to the left lower mandible extending back towards the ear.  She denies any trouble swallowing.  Patient is controlling her secretions with normal speech.  Will order labs and CT imaging.  LR and Fentanyl given for pain.   I-stat HCG negative. CBC, CMP, and lactic pending. Awaiting Soft tissue neck CT scan. Labs have been in process for almost two hours. Called and spoke with [facility] and she reports back that the labs should result in the next ten minutes. Will handoff patient to the oncoming team.  3:18 PM Care of [facility]  transferred to Dr. Judith Blonder at the end of my shift as the patient will require reassessment once labs/imaging have resulted. Patient presentation, ED course, and plan of care discussed with review of all pertinent labs and imaging. Please see his/her note for further details regarding further ED course and disposition. Plan at time of handoff is awaiting CT soft tissue neck. Pending labs. With physical exam and recent admission for the same dental infection, I suspect likely admission. This may be altered or completely changed at the discretion of the oncoming team pending results of further workup.    Final Clinical Impression(s) / ED Diagnoses Final diagnoses:  None    Rx / DC Orders ED Discharge Orders     None         Sherrell Puller, Hershal Coria 10/09/21 Nimmons, Nathan, MD 10/10/21 [phone]

## 2021-10-09 NOTE — ED Notes (Signed)
To ct

## 2021-10-09 NOTE — ED Notes (Signed)
The iv has infiltrated  iv team  requested

## 2021-10-09 NOTE — ED Provider Triage Note (Signed)
Emergency Medicine Provider Triage Evaluation Note  Roberta Bowman , a 36 y.o. female  was evaluated in triage.  Pt complains of dental pain and left jaw pain x 3 days.  Patient states that she has been having sharp shooting pains and swelling of the left side of her jaw.  She has been trying ibuprofen and aspirin with minimal relief.  She states that she is having pain with swallowing, especially when eating.  Review of Systems  Positive: Dental pain, left jaw pain, sore throat Negative: Fever, chills, vomiting, difficulty breathing  Physical Exam  BP (!) 156/93 (BP Location: Right Arm)    Pulse (!) 127    Temp 98.1 F (36.7 C) (Oral)    Resp 20    SpO2 97%  Gen:   Awake, no distress   Resp:  Normal effort  MSK:   Moves extremities without difficulty  Other:  Poor dentition, possible dental abscess with minimal gingival swelling, no evidence of PTA or Ludwig's  Medical Decision Making  Medically screening exam initiated at 10:20 AM.  Appropriate orders placed.  Judeth Shein was informed that the remainder of the evaluation will be completed by another provider, this initial triage assessment does not replace that evaluation, and the importance of remaining in the ED until their evaluation is complete.     Amia Rynders T, PA-C 10/09/21 1022

## 2021-10-09 NOTE — ED Notes (Signed)
Pt alert oriented she reports that she is waiting for c-t

## 2021-10-09 NOTE — ED Provider Notes (Signed)
I assumed care of this patient from [place]. Briefly: This a 36 year old female who was previously admitted to our hospital for dental abscess and was on IV Unasyn.  The patient left AMA prior to surgical drainage was performed.  He presented again today with severe dental pain and draining of foul-smelling discharge from the after mentioned abscess.  Physical Exam  BP 117/83    Pulse 85    Temp 98.9 F (37.2 C)    Resp 18    SpO2 99%   Procedures  Procedures  ED Course / MDM    Medical Decision Making Amount and/or Complexity of Data Reviewed Labs: ordered. Radiology: ordered.  Risk Prescription drug management. Decision regarding hospitalization.    The patient was initially tachycardic on arrival however she was not Febrile. Labwork had returned prior to me assuming care which revealed her to have an elevated leukocytosis and lactic acid.  CT of the patient's face/neck was pending.  The CT was reviewed independently which revealed improvement of her abscess formation.  I suspect that her abscess is improving due to the fact that it is draining in the oral cavity.  However she does have signs of sepsis given that she has elevated lactic acidosis leukocytosis and was tachycardic on arrival.  I will fluid resuscitate the patient, start her on Unasyn, obtain blood cultures and discussed with the hospitalist team whether she would benefit from admission  Hospitalist team agrees that the patient would benefit from admission and possibly oral maxillofacial surgery consultation.    Zachery Dakins, MD 10/09/21 [id number]    Sherwood Gambler, MD 10/12/21 [phone]

## 2021-10-10 ENCOUNTER — Other Ambulatory Visit: Payer: Self-pay

## 2021-10-10 LAB — BASIC METABOLIC PANEL
Anion gap: 6 (ref 5–15)
BUN: 5 mg/dL — ABNORMAL LOW (ref 6–20)
CO2: 27 mmol/L (ref 22–32)
Calcium: 8.7 mg/dL — ABNORMAL LOW (ref 8.9–10.3)
Chloride: 100 mmol/L (ref 98–111)
Creatinine, Ser: 0.75 mg/dL (ref 0.44–1.00)
GFR, Estimated: >60 mL/min (ref >60)
Glucose, Bld: 121 mg/dL — ABNORMAL HIGH (ref 70–99)
Potassium: 3.3 mmol/L — ABNORMAL LOW (ref 3.5–5.1)
Sodium: 133 mmol/L — ABNORMAL LOW (ref 135–145)

## 2021-10-10 LAB — CBC
HCT: 27.7 % — ABNORMAL LOW (ref 36.0–46.0)
Hemoglobin: 7.9 g/dL — ABNORMAL LOW (ref 12.0–15.0)
MCH: 19.3 pg — ABNORMAL LOW (ref 26.0–34.0)
MCHC: 28.5 g/dL — ABNORMAL LOW (ref 30.0–36.0)
MCV: 67.6 fL — ABNORMAL LOW (ref 80.0–100.0)
Platelets: 402 10*3/uL — ABNORMAL HIGH (ref 150–400)
RBC: 4.1 MIL/uL (ref 3.87–5.11)
RDW: 17.1 % — ABNORMAL HIGH (ref 11.5–15.5)
WBC: 15.8 10*3/uL — ABNORMAL HIGH (ref 4.0–10.5)
nRBC: 0 % (ref 0.0–0.2)

## 2021-10-10 LAB — IRON AND TIBC
Iron: 19 ug/dL — ABNORMAL LOW (ref 28–170)
Saturation Ratios: 5 % — ABNORMAL LOW (ref 10.4–31.8)
TIBC: 375 ug/dL (ref 250–450)
UIBC: 356 ug/dL

## 2021-10-10 LAB — RETICULOCYTES
Immature Retic Fract: 29.1 % — ABNORMAL HIGH (ref 2.3–15.9)
RBC.: 4.06 MIL/uL (ref 3.87–5.11)
Retic Count, Absolute: 84 10*3/uL (ref 19.0–186.0)
Retic Ct Pct: 2.1 % (ref 0.4–3.1)

## 2021-10-10 LAB — LACTIC ACID, PLASMA
Lactic Acid, Venous: 2.1 mmol/L (ref 0.5–1.9)
Lactic Acid, Venous: 2.1 mmol/L (ref 0.5–1.9)
Lactic Acid, Venous: 0.9 mmol/L (ref 0.5–1.9)
Lactic Acid, Venous: 0.9 mmol/L (ref 0.5–1.9)

## 2021-10-10 LAB — VITAMIN B12: Vitamin B-12: 412 pg/mL (ref 180–914)

## 2021-10-10 LAB — FERRITIN: Ferritin: 15 ng/mL (ref 11–307)

## 2021-10-10 LAB — FOLATE: Folate: 6.5 ng/mL (ref >5.9)

## 2021-10-10 MED ORDER — HYDROMORPHONE HCL 1 MG/ML IJ SOLN
0.5000 mg | INTRAMUSCULAR | Status: DC | PRN
Start: 2021-10-10 — End: 2021-10-11
  Administered 2021-10-10 (×2): 0.5 mg via INTRAVENOUS
  Filled 2021-10-10 (×2): qty 0.5

## 2021-10-10 NOTE — Plan of Care (Signed)
  Problem: Activity: Goal: Risk for activity intolerance will decrease Outcome: Progressing   Problem: Nutrition: Goal: Adequate nutrition will be maintained Outcome: Progressing   Problem: Coping: Goal: Level of anxiety will decrease Outcome: Progressing   Problem: Elimination: Goal: Will not experience complications related to urinary retention Outcome: Progressing   Problem: Safety: Goal: Ability to remain free from injury will improve Outcome: Progressing   Problem: Skin Integrity: Goal: Risk for impaired skin integrity will decrease Outcome: Progressing   

## 2021-10-10 NOTE — Progress Notes (Signed)
PROGRESS NOTE    Roberta Bowman  ZOX:[id number] DOB: 22-Jul-1986 DOA: 10/09/2021 PCP: Patient, No Pcp Per (Inactive)   Brief Narrative:  Roberta Bowman is a 36 y.o. female with medical history significant of Obesity, multiple dental caries. Pt with poor dentition chronically.   Pt recently admitted in Dec for oral abscess related to poor dentition.  Started on ABx.  I+D recd at that time but pt refused surgery and left AMA after she started feeling better with ABx.  Completed course of outpt Augmentin.   Initially doing better, she presented to the ED for worsening L bottom dental pain for past 3-4 days.  Denies fever, sore throat, CP, SOB. No trouble swallowing, does have pain with chewing.   CT soft tissue neck this admission reveals improvement in overall infectious picture: specifically abscess seen previously has resolved. WBC 16k, initial lactate 2.7, initial tachycardia.  Patient was admitted as sepsis secondary to dental infection.  Assessment & Plan:   Principal Problem:   Oral infection Active Problems:   Microcytic hypochromic anemia   Dental infection   Sepsis (HCC)  Severe sepsis secondary to dental infection: Patient initially meets criteria for severe sepsis based on tachycardia, leukocytosis and lactic acid of 2.7.  I consulted oral surgeon on call/Dr. Ross Marcus who reviewed the images and notified me that there is no residual abscess for I&D or any surgical intervention as inpatient however he recommended continuing IV antibiotics for now and to send patient to his office on Monday and he will plan to do multiple extractions the same day.  Patient is feeling better, she is able to open her mouth at least 60%.  Has no problem swallowing.  I will continue IV Unasyn.  Chronic microcytic anemia: We will discharge this patient on oral iron.  DVT prophylaxis: SCDs Start: 10/09/21 2050   Code Status: Full Code  Family Communication:  None present at bedside.  Plan of care  discussed with patient in length and he/she verbalized understanding and agreed with it.  Status is: Observation  The patient will require care spanning > 2 midnights and should be moved to inpatient because: Needs IV antibiotics.  Estimated body mass index is 41.47 kg/m as calculated from the following:   Height as of this encounter: 5\' 4"  (1.626 m).   Weight as of this encounter: 109.6 kg.    Nutritional Assessment: Body mass index is 41.47 kg/m. Seen by dietician.  I agree with the assessment and plan as outlined below: Nutrition Status:        . Skin Assessment: I have examined the patient's skin and I agree with the wound assessment as performed by the wound care RN as outlined below:    Consultants:  Oral surgeon  Procedures:  None  Antimicrobials:  Anti-infectives (From admission, onward)    Start     Dose/Rate Route Frequency Ordered Stop   10/09/21 2200  Ampicillin-Sulbactam (UNASYN) 3 g in sodium chloride 0.9 % 100 mL IVPB        3 g 200 mL/hr over 30 Minutes Intravenous Every 6 hours 10/09/21 2056     10/09/21 1600  ampicillin-sulbactam (UNASYN) 1.5 g in sodium chloride 0.9 % 100 mL IVPB        1.5 g 200 mL/hr over 30 Minutes Intravenous  Once 10/09/21 1546 10/09/21 1853         Subjective: Seen and examined.  Still complains of severe pain in the left jaw but improving compared to yesterday.  Objective: Vitals:  10/10/21 0230 10/10/21 0244 10/10/21 0610 10/10/21 0756  BP: 93/63  110/62 95/62  Pulse: 92  81 75  Resp: 18  17 16   Temp: 97.8 F (36.6 C)  97.8 F (36.6 C) (!) 97.5 F (36.4 C)  TempSrc: Oral  Oral Oral  SpO2: 100%  98% 99%  Weight:  109.6 kg    Height:  5\' 4"  (1.626 m)      Intake/Output Summary (Last 24 hours) at 10/10/2021 1332 Last data filed at 10/10/2021 10/12/2021 Gross per 24 hour  Intake 791.06 ml  Output --  Net 791.06 ml   Filed Weights   10/10/21 0244  Weight: 109.6 kg    Examination:  General exam: Appears  calm and comfortable, multiple caries on visual examination of the oral cavities. Respiratory system: Clear to auscultation. Respiratory effort normal. Cardiovascular system: S1 & S2 heard, RRR. No JVD, murmurs, rubs, gallops or clicks. No pedal edema. Gastrointestinal system: Abdomen is nondistended, soft and nontender. No organomegaly or masses felt. Normal bowel sounds heard. Central nervous system: Alert and oriented. No focal neurological deficits. Extremities: Symmetric 5 x 5 power. Skin: No rashes, lesions or ulcers Psychiatry: Judgement and insight appear normal. Mood & affect appropriate.    Data Reviewed: I have personally reviewed following labs and imaging studies  CBC: Recent Labs  Lab 10/09/21 1327 10/10/21 0323  WBC 16.4* 15.8*  NEUTROABS 12.5*  --   HGB 9.8* 7.9*  HCT 36.5 27.7*  MCV 69.9* 67.6*  PLT 504* 402*   Basic Metabolic Panel: Recent Labs  Lab 10/09/21 1327 10/10/21 0323  NA 134* 133*  K 3.6 3.3*  CL 98 100  CO2 24 27  GLUCOSE 114* 121*  BUN 5* 5*  CREATININE 0.80 0.75  CALCIUM 9.2 8.7*   GFR: Estimated Creatinine Clearance: 117.7 mL/min (by C-G formula based on SCr of 0.75 mg/dL). Liver Function Tests: Recent Labs  Lab 10/09/21 1327  AST 21  ALT 18  ALKPHOS 106  BILITOT 1.5*  PROT 9.0*  ALBUMIN 3.7   No results for input(s): LIPASE, AMYLASE in the last 168 hours. No results for input(s): AMMONIA in the last 168 hours. Coagulation Profile: No results for input(s): INR, PROTIME in the last 168 hours. Cardiac Enzymes: No results for input(s): CKTOTAL, CKMB, CKMBINDEX, TROPONINI in the last 168 hours. BNP (last 3 results) No results for input(s): PROBNP in the last 8760 hours. HbA1C: No results for input(s): HGBA1C in the last 72 hours. CBG: No results for input(s): GLUCAP in the last 168 hours. Lipid Profile: No results for input(s): CHOL, HDL, LDLCALC, TRIG, CHOLHDL, LDLDIRECT in the last 72 hours. Thyroid Function Tests: No  results for input(s): TSH, T4TOTAL, FREET4, T3FREE, THYROIDAB in the last 72 hours. Anemia Panel: Recent Labs    10/10/21 0307  VITAMINB12 412  FOLATE 6.5  FERRITIN 15  TIBC 375  IRON 19*  RETICCTPCT 2.1   Sepsis Labs: Recent Labs  Lab 10/09/21 1628 10/10/21 0307 10/10/21 0918 10/10/21 1240  LATICACIDVEN 2.1* 2.1*   2.1* 0.9 0.9    Recent Results (from the past 240 hour(s))  Blood culture (routine x 2)     Status: None (Preliminary result)   Collection Time: 10/09/21  4:29 PM   Specimen: BLOOD  Result Value Ref Range Status   Specimen Description BLOOD SITE NOT SPECIFIED  Final   Special Requests   Final    BOTTLES DRAWN AEROBIC AND ANAEROBIC Blood Culture adequate volume   Culture   Final  NO GROWTH < 24 HOURS Performed at [facility], [address] [place], [place] [address]    Report Status PENDING  Incomplete  Blood culture (routine x 2)     Status: None (Preliminary result)   Collection Time: 10/09/21  4:29 PM   Specimen: BLOOD  Result Value Ref Range Status   Specimen Description BLOOD SITE NOT SPECIFIED  Final   Special Requests   Final    BOTTLES DRAWN AEROBIC AND ANAEROBIC Blood Culture adequate volume   Culture   Final    NO GROWTH < 24 HOURS Performed at [facility], [address] [place], [place] [address]    Report Status PENDING  Incomplete  Resp Panel by RT-PCR (Flu A&B, Covid) Nasopharyngeal Swab     Status: None   Collection Time: 10/09/21  9:58 PM   Specimen: Nasopharyngeal Swab; Nasopharyngeal(NP) swabs in vial transport medium  Result Value Ref Range Status   SARS Coronavirus 2 by RT PCR NEGATIVE NEGATIVE Final    Comment: (NOTE) SARS-CoV-2 target nucleic acids are NOT DETECTED.  The SARS-CoV-2 RNA is generally detectable in upper respiratory specimens during the acute phase of infection. The lowest concentration of SARS-CoV-2 viral copies this assay can detect is 138 copies/mL. A negative result does not preclude  SARS-Cov-2 infection and should not be used as the sole basis for treatment or other patient management decisions. A negative result may occur with  improper specimen collection/handling, submission of specimen other than nasopharyngeal swab, presence of viral mutation(s) within the areas targeted by this assay, and inadequate number of viral copies(<138 copies/mL). A negative result must be combined with clinical observations, patient history, and epidemiological information. The expected result is Negative.  Fact Sheet for Patients:  BloggerCourse.comhttps://www.fda.gov/media/152166/download  Fact Sheet for Healthcare Providers:  SeriousBroker.ithttps://www.fda.gov/media/152162/download  This test is no t yet approved or cleared by the [place] FDA and  has been authorized for detection and/or diagnosis of SARS-CoV-2 by FDA under an Emergency Use Authorization (EUA). This EUA will remain  in effect (meaning this test can be used) for the duration of the COVID-19 declaration under Section 564(b)(1) of the Act, 21 U.S.C.section 360bbb-3(b)(1), unless the authorization is terminated  or revoked sooner.       Influenza A by PCR NEGATIVE NEGATIVE Final   Influenza B by PCR NEGATIVE NEGATIVE Final    Comment: (NOTE) The Xpert Xpress SARS-CoV-2/FLU/RSV plus assay is intended as an aid in the diagnosis of influenza from Nasopharyngeal swab specimens and should not be used as a sole basis for treatment. Nasal washings and aspirates are unacceptable for Xpert Xpress SARS-CoV-2/FLU/RSV testing.  Fact Sheet for Patients: BloggerCourse.comhttps://www.fda.gov/media/152166/download  Fact Sheet for Healthcare Providers: SeriousBroker.ithttps://www.fda.gov/media/152162/download  This test is not yet approved or cleared by the [place] FDA and has been authorized for detection and/or diagnosis of SARS-CoV-2 by FDA under an Emergency Use Authorization (EUA). This EUA will remain in effect (meaning this test can be used) for the duration of  the COVID-19 declaration under Section 564(b)(1) of the Act, 21 U.S.C. section 360bbb-3(b)(1), unless the authorization is terminated or revoked.  Performed at [facility], [address] [place], [place] [address]      Radiology Studies: CT Soft Tissue Neck W Contrast  Result Date: 10/09/2021 CLINICAL DATA:  Face swelling, infection, pain, prior dental abscess on the left EXAM: CT NECK WITH CONTRAST TECHNIQUE: Multidetector CT imaging of the neck was performed using the standard protocol following the bolus administration  of intravenous contrast. RADIATION DOSE REDUCTION: This exam was performed according to the departmental dose-optimization program which includes automated exposure control, adjustment of the mA and/or kV according to patient size and/or use of iterative reconstruction technique. CONTRAST:  75mL OMNIPAQUE IOHEXOL 350 MG/ML SOLN COMPARISON:  09/03/2021 FINDINGS: Pharynx and larynx: No mass or swelling. Salivary glands: No inflammation, mass, or stone. Thyroid: Normal. Lymph nodes: Enlarged left submandibular and left upper cervical chain lymph nodes, which are stable to slightly decreased in size compared to the prior exam. For example a left level 2A lymph node measures up to 1.2 cm in short axis, previously 1.3 cm. A left level 1B lymph node measures 9 mm in short axis (series 3, image 62), previously 10 mm. Vascular: Negative. Limited intracranial: Negative. Visualized orbits: Negative. Mastoids and visualized paranasal sinuses: Minimal mucosal thickening in the right maxillary sinus. Otherwise negative. The mastoids are well aerated. Skeleton: No acute osseous abnormality. Upper chest: Evaluation is somewhat limited by respiratory motion. No focal pulmonary opacity or pleural effusion. Other: Previously noted edema and fat stranding along the inferior aspect of the left mandibular body has significantly decreased, with only minimal fat stranding remaining (series 3, image  65). The previously noted abscess is no longer seen. Periapical lucency remains about the most posterior left mandibular molar (series 4, image 65), with redemonstrated cortical breakthrough (series 4, image 64). IMPRESSION: 1. Significant improvement in the appearance of the left submandibular tissues, with resolution of previously noted edema, fat stranding, in abscess along the inferior aspect of the left mandibular body. There is persistent periapical lucency about the most posterior left mandibular molar, which continues to show cortical breakthrough, but only mild stranding in the adjacent soft tissues. 2. Redemonstrated enlarged left submandibular and upper cervical chain lymph nodes, likely reactive. Electronically Signed   By: Wiliam KeAlison  Vasan M.D.   On: 10/09/2021 18:43    Scheduled Meds: Continuous Infusions:  ampicillin-sulbactam (UNASYN) IV 3 g (10/10/21 1149)     LOS: 0 days   Time spent: 32 minutes  Hughie Clossavi Matilynn Dacey, MD [facility]  10/10/2021, 1:32 PM  Please page via Amion and do not message via secure chat for anything urgent. Secure chat can be used for anything non urgent.  How to contact the [facility] Attending or Consulting provider 7A - 7P or covering provider during after hours 7P -7A, for this patient?  Check the care team in [facility] and look for a) attending/consulting [facility] provider listed and b) the [facility] team listed. Page or secure chat 7A-7P. Log into [email or web address] and use [place]'s universal password to access. If you do not have the password, please contact the hospital operator. Locate the [facility] provider you are looking for under [facility] and page to a number that you can be directly reached. If you still have difficulty reaching the provider, please page the [facility] (Director on Call) for the Hospitalists listed on amion for assistance.

## 2021-10-11 LAB — CBC WITH DIFFERENTIAL/PLATELET
Abs Immature Granulocytes: 0.06 10*3/uL (ref 0.00–0.07)
Basophils Absolute: 0 10*3/uL (ref 0.0–0.1)
Basophils Relative: 0 %
Eosinophils Absolute: 0.3 10*3/uL (ref 0.0–0.5)
Eosinophils Relative: 3 %
HCT: 27.7 % — ABNORMAL LOW (ref 36.0–46.0)
Hemoglobin: 7.6 g/dL — ABNORMAL LOW (ref 12.0–15.0)
Immature Granulocytes: 1 %
Lymphocytes Relative: 26 %
Lymphs Abs: 2.5 10*3/uL (ref 0.7–4.0)
MCH: 18.8 pg — ABNORMAL LOW (ref 26.0–34.0)
MCHC: 27.4 g/dL — ABNORMAL LOW (ref 30.0–36.0)
MCV: 68.6 fL — ABNORMAL LOW (ref 80.0–100.0)
Monocytes Absolute: 0.6 10*3/uL (ref 0.1–1.0)
Monocytes Relative: 6 %
Neutro Abs: 6.3 10*3/uL (ref 1.7–7.7)
Neutrophils Relative %: 64 %
Platelets: 353 10*3/uL (ref 150–400)
RBC: 4.04 MIL/uL (ref 3.87–5.11)
RDW: 16.9 % — ABNORMAL HIGH (ref 11.5–15.5)
WBC: 9.7 10*3/uL (ref 4.0–10.5)
nRBC: 0 % (ref 0.0–0.2)

## 2021-10-11 LAB — BASIC METABOLIC PANEL
Anion gap: 7 (ref 5–15)
BUN: <5 mg/dL — ABNORMAL LOW (ref 6–20)
CO2: 27 mmol/L (ref 22–32)
Calcium: 8.4 mg/dL — ABNORMAL LOW (ref 8.9–10.3)
Chloride: 101 mmol/L (ref 98–111)
Creatinine, Ser: 0.72 mg/dL (ref 0.44–1.00)
GFR, Estimated: >60 mL/min (ref >60)
Glucose, Bld: 100 mg/dL — ABNORMAL HIGH (ref 70–99)
Potassium: 3.2 mmol/L — ABNORMAL LOW (ref 3.5–5.1)
Sodium: 135 mmol/L (ref 135–145)

## 2021-10-11 LAB — MAGNESIUM: Magnesium: 2.1 mg/dL (ref 1.7–2.4)

## 2021-10-11 MED ORDER — AMOXICILLIN-POT CLAVULANATE 875-125 MG PO TABS: 1.0000 | ORAL_TABLET | Freq: Two times a day (BID) | ORAL | 0 refills | Status: AC

## 2021-10-11 NOTE — Discharge Summary (Signed)
[place] Discharge Summary  Roberta Bowman [id number] DOB: Oct 31, 1985 DOA: 10/09/2021  PCP: Patient, No Pcp Per (Inactive)  Admit date: 10/09/2021 Discharge date: 10/11/2021 30 Day Unplanned Readmission Risk Score    Flowsheet Row ED to Hosp-Admission (Current) from 10/09/2021 in [facility]  30 Day Unplanned Readmission Risk Score (%) 6.57 Filed at 10/11/2021 0801       This score is the patient's risk of an unplanned readmission within 30 days of being discharged (0 -100%). The score is based on dignosis, age, lab data, medications, orders, and past utilization.   Low:  0-14.9   Medium: 15-21.9   High: 22-29.9   Extreme: 30 and above          Admitted From: Home Disposition: Home  Recommendations for Outpatient Follow-up:  Follow up with PCP in 1-2 weeks Please obtain BMP/CBC in one week Please go to Dr. Cheri Fowler office tomorrow for multiple dental extraction.  Please remain n.p.o. from midnight tonight. Please follow up with your PCP on the following pending results: Unresulted Labs (From admission, onward)    None         Home Health: None Equipment/Devices: None  Discharge Condition: Stable CODE STATUS: Full code Diet recommendation: Regular  Subjective: Seen and examined.  He still has pain in the left jaw but improved.  She is ready to go home today.  Brief/Interim Summary: Roberta Bowman is a 36 y.o. female with medical history significant of Obesity, multiple dental caries. Pt with poor dentition chronically.   Pt recently admitted in Dec for oral abscess related to poor dentition.  Started on ABx.  I+D recd at that time but pt refused surgery and left AMA after she started feeling better with ABx.  Completed course of outpt Augmentin.   Initially doing better, she presented to the ED for worsening L bottom dental pain for past 3-4 days.  Denies fever, sore throat, CP, SOB. No trouble swallowing, does have pain with  chewing.   CT soft tissue neck this admission reveals improvement in overall infectious picture: specifically abscess seen previously has resolved. WBC 16k, initial lactate 2.7, initial tachycardia.  Patient was admitted as sepsis secondary to dental infection.  Started and continued on Unasyn. I consulted oral surgeon on call/Dr. Conley Simmonds who reviewed the images and said that there is no residual abscess for I&D or any surgical intervention as inpatient however he recommended continuing IV antibiotics for now and to send patient to his office on Monday and he will plan to do multiple extractions the same day.  Patient is feeling better today, tolerating diet.  She is being discharged home.  I will prescribe her 7 more days of oral Augmentin per oral surgery recommendations.  She is informed to go to Dr. Cheri Fowler office tomorrow and to remain n.p.o. from midnight.   Chronic microcytic anemia: We will discharge this patient on oral iron.  Discharge plan was discussed with patient and/or family member and they verbalized understanding and agreed with it.  Discharge Diagnoses:  Principal Problem:   Oral infection Active Problems:   Microcytic hypochromic anemia   Dental infection   Sepsis ([place])    Discharge Instructions   Allergies as of 10/11/2021   No Known Allergies      Medication List     TAKE these medications    amoxicillin-clavulanate 875-125 MG tablet Commonly known as: Augmentin Take 1 tablet by mouth 2 (two) times daily for 7 days.   Ferrous  Sulfate 28 MG Tabs Take 1 tablet (28 mg total) by mouth daily.   ibuprofen 200 MG tablet Commonly known as: ADVIL Take 200 mg by mouth every 6 (six) hours as needed for headache or moderate pain.        Follow-up Information     Gardner Candle, DMD. Go on 10/12/2021.   Specialty: Oral Surgery Contact information: [address] [place] [address] [phone]                No Known  Allergies  Consultations: Oral surgery   Procedures/Studies: CT Soft Tissue Neck W Contrast  Result Date: 10/09/2021 CLINICAL DATA:  Face swelling, infection, pain, prior dental abscess on the left EXAM: CT NECK WITH CONTRAST TECHNIQUE: Multidetector CT imaging of the neck was performed using the standard protocol following the bolus administration of intravenous contrast. RADIATION DOSE REDUCTION: This exam was performed according to the departmental dose-optimization program which includes automated exposure control, adjustment of the mA and/or kV according to patient size and/or use of iterative reconstruction technique. CONTRAST:  33mL OMNIPAQUE IOHEXOL 350 MG/ML SOLN COMPARISON:  09/03/2021 FINDINGS: Pharynx and larynx: No mass or swelling. Salivary glands: No inflammation, mass, or stone. Thyroid: Normal. Lymph nodes: Enlarged left submandibular and left upper cervical chain lymph nodes, which are stable to slightly decreased in size compared to the prior exam. For example a left level 2A lymph node measures up to 1.2 cm in short axis, previously 1.3 cm. A left level 1B lymph node measures 9 mm in short axis (series 3, image 62), previously 10 mm. Vascular: Negative. Limited intracranial: Negative. Visualized orbits: Negative. Mastoids and visualized paranasal sinuses: Minimal mucosal thickening in the right maxillary sinus. Otherwise negative. The mastoids are well aerated. Skeleton: No acute osseous abnormality. Upper chest: Evaluation is somewhat limited by respiratory motion. No focal pulmonary opacity or pleural effusion. Other: Previously noted edema and fat stranding along the inferior aspect of the left mandibular body has significantly decreased, with only minimal fat stranding remaining (series 3, image 65). The previously noted abscess is no longer seen. Periapical lucency remains about the most posterior left mandibular molar (series 4, image 65), with redemonstrated cortical breakthrough  (series 4, image 64). IMPRESSION: 1. Significant improvement in the appearance of the left submandibular tissues, with resolution of previously noted edema, fat stranding, in abscess along the inferior aspect of the left mandibular body. There is persistent periapical lucency about the most posterior left mandibular molar, which continues to show cortical breakthrough, but only mild stranding in the adjacent soft tissues. 2. Redemonstrated enlarged left submandibular and upper cervical chain lymph nodes, likely reactive. Electronically Signed   By: Merilyn Baba M.D.   On: 10/09/2021 18:43     Discharge Exam: Vitals:   10/11/21 0506 10/11/21 0748  BP: 115/71 130/78  Pulse: 78 77  Resp: 16 16  Temp: 97.7 F (36.5 C)   SpO2: 99% 98%   Vitals:   10/10/21 1551 10/10/21 2115 10/11/21 0506 10/11/21 0748  BP: 106/63 121/87 115/71 130/78  Pulse: 73 91 78 77  Resp: 16 16 16 16   Temp: 97.8 F (36.6 C) 97.9 F (36.6 C) 97.7 F (36.5 C)   TempSrc: Oral Oral Oral   SpO2: 100% 100% 99% 98%  Weight:      Height:        General: Pt is alert, awake, not in acute distress Cardiovascular: RRR, S1/S2 +, no rubs, no gallops Respiratory: CTA bilaterally, no wheezing,  no rhonchi Abdominal: Soft, NT, ND, bowel sounds + Extremities: no edema, no cyanosis    The results of significant diagnostics from this hospitalization (including imaging, microbiology, ancillary and laboratory) are listed below for reference.     Microbiology: Recent Results (from the past 240 hour(s))  Blood culture (routine x 2)     Status: None (Preliminary result)   Collection Time: 10/09/21  4:29 PM   Specimen: BLOOD  Result Value Ref Range Status   Specimen Description BLOOD SITE NOT SPECIFIED  Final   Special Requests   Final    BOTTLES DRAWN AEROBIC AND ANAEROBIC Blood Culture adequate volume   Culture   Final    NO GROWTH 2 DAYS Performed at [facility], [address], [place], [place] [address]     Report Status PENDING  Incomplete  Blood culture (routine x 2)     Status: None (Preliminary result)   Collection Time: 10/09/21  4:29 PM   Specimen: BLOOD  Result Value Ref Range Status   Specimen Description BLOOD SITE NOT SPECIFIED  Final   Special Requests   Final    BOTTLES DRAWN AEROBIC AND ANAEROBIC Blood Culture adequate volume   Culture   Final    NO GROWTH 2 DAYS Performed at [facility], [place] [address] [place], [place] [address]    Report Status PENDING  Incomplete  Resp Panel by RT-PCR (Flu A&B, Covid) Nasopharyngeal Swab     Status: None   Collection Time: 10/09/21  9:58 PM   Specimen: Nasopharyngeal Swab; Nasopharyngeal(NP) swabs in vial transport medium  Result Value Ref Range Status   SARS Coronavirus 2 by RT PCR NEGATIVE NEGATIVE Final    Comment: (NOTE) SARS-CoV-2 target nucleic acids are NOT DETECTED.  The SARS-CoV-2 RNA is generally detectable in upper respiratory specimens during the acute phase of infection. The lowest concentration of SARS-CoV-2 viral copies this assay can detect is 138 copies/mL. A negative result does not preclude SARS-Cov-2 infection and should not be used as the sole basis for treatment or other patient management decisions. A negative result may occur with  improper specimen collection/handling, submission of specimen other than nasopharyngeal swab, presence of viral mutation(s) within the areas targeted by this assay, and inadequate number of viral copies(<138 copies/mL). A negative result must be combined with clinical observations, patient history, and epidemiological information. The expected result is Negative.  Fact Sheet for Patients:  EntrepreneurPulse.com.au  Fact Sheet for Healthcare Providers:  IncredibleEmployment.be  This test is no t yet approved or cleared by the [place] FDA and  has been authorized for detection and/or diagnosis of SARS-CoV-2 by FDA under an  Emergency Use Authorization (EUA). This EUA will remain  in effect (meaning this test can be used) for the duration of the COVID-19 declaration under Section 564(b)(1) of the Act, 21 U.S.C.section 360bbb-3(b)(1), unless the authorization is terminated  or revoked sooner.       Influenza A by PCR NEGATIVE NEGATIVE Final   Influenza B by PCR NEGATIVE NEGATIVE Final    Comment: (NOTE) The Xpert Xpress SARS-CoV-2/FLU/RSV plus assay is intended as an aid in the diagnosis of influenza from Nasopharyngeal swab specimens and should not be used as a sole basis for treatment. Nasal washings and aspirates are unacceptable for Xpert Xpress SARS-CoV-2/FLU/RSV testing.  Fact Sheet for Patients: EntrepreneurPulse.com.au  Fact Sheet for Healthcare Providers: IncredibleEmployment.be  This test is not yet approved or cleared by the [place] and has been authorized  for detection and/or diagnosis of SARS-CoV-2 by FDA under an Emergency Use Authorization (EUA). This EUA will remain in effect (meaning this test can be used) for the duration of the COVID-19 declaration under Section 564(b)(1) of the Act, 21 U.S.C. section 360bbb-3(b)(1), unless the authorization is terminated or revoked.  Performed at [facility], [place] [address] [place], [place] [address]      Labs: BNP (last 3 results) No results for input(s): BNP in the last 8760 hours. Basic Metabolic Panel: Recent Labs  Lab 10/09/21 1327 10/10/21 0323 10/11/21 0756  NA 134* 133* 135  K 3.6 3.3* 3.2*  CL 98 100 101  CO2 24 27 27   GLUCOSE 114* 121* 100*  BUN 5* 5* <5*  CREATININE 0.80 0.75 0.72  CALCIUM 9.2 8.7* 8.4*  MG  --   --  2.1   Liver Function Tests: Recent Labs  Lab 10/09/21 1327  AST 21  ALT 18  ALKPHOS 106  BILITOT 1.5*  PROT 9.0*  ALBUMIN 3.7   No results for input(s): LIPASE, AMYLASE in the last 168 hours. No results for input(s): AMMONIA in the last 168  hours. CBC: Recent Labs  Lab 10/09/21 1327 10/10/21 0323 10/11/21 0756  WBC 16.4* 15.8* 9.7  NEUTROABS 12.5*  --  6.3  HGB 9.8* 7.9* 7.6*  HCT 36.5 27.7* 27.7*  MCV 69.9* 67.6* 68.6*  PLT 504* 402* 353   Cardiac Enzymes: No results for input(s): CKTOTAL, CKMB, CKMBINDEX, TROPONINI in the last 168 hours. BNP: Invalid input(s): POCBNP CBG: No results for input(s): GLUCAP in the last 168 hours. D-Dimer No results for input(s): DDIMER in the last 72 hours. Hgb A1c No results for input(s): HGBA1C in the last 72 hours. Lipid Profile No results for input(s): CHOL, HDL, LDLCALC, TRIG, CHOLHDL, LDLDIRECT in the last 72 hours. Thyroid function studies No results for input(s): TSH, T4TOTAL, T3FREE, THYROIDAB in the last 72 hours.  Invalid input(s): FREET3 Anemia work up Recent Labs    10/10/21 0307  VITAMINB12 412  FOLATE 6.5  FERRITIN 15  TIBC 375  IRON 19*  RETICCTPCT 2.1   Urinalysis    Component Value Date/Time   COLORURINE BROWN (A) 12/29/2020 1340   APPEARANCEUR HAZY (A) 12/29/2020 1340   LABSPEC <1.005 (L) 12/29/2020 1340   PHURINE 5.5 12/29/2020 1340   GLUCOSEU NEGATIVE 12/29/2020 1340   HGBUR LARGE (A) 12/29/2020 1340   BILIRUBINUR NEGATIVE 12/29/2020 1340   [place] 12/29/2020 1340   PROTEINUR 30 (A) 12/29/2020 1340   NITRITE NEGATIVE 12/29/2020 1340   LEUKOCYTESUR MODERATE (A) 12/29/2020 1340   Sepsis Labs Invalid input(s): PROCALCITONIN,  WBC,  LACTICIDVEN Microbiology Recent Results (from the past 240 hour(s))  Blood culture (routine x 2)     Status: None (Preliminary result)   Collection Time: 10/09/21  4:29 PM   Specimen: BLOOD  Result Value Ref Range Status   Specimen Description BLOOD SITE NOT SPECIFIED  Final   Special Requests   Final    BOTTLES DRAWN AEROBIC AND ANAEROBIC Blood Culture adequate volume   Culture   Final    NO GROWTH 2 DAYS Performed at [facility], [place] [address], [place], [place] [address]    Report  Status PENDING  Incomplete  Blood culture (routine x 2)     Status: None (Preliminary result)   Collection Time: 10/09/21  4:29 PM   Specimen: BLOOD  Result Value Ref Range Status   Specimen Description BLOOD SITE NOT SPECIFIED  Final  Special Requests   Final    BOTTLES DRAWN AEROBIC AND ANAEROBIC Blood Culture adequate volume   Culture   Final    NO GROWTH 2 DAYS Performed at [facility], [place] [address] [place], [place] [address]    Report Status PENDING  Incomplete  Resp Panel by RT-PCR (Flu A&B, Covid) Nasopharyngeal Swab     Status: None   Collection Time: 10/09/21  9:58 PM   Specimen: Nasopharyngeal Swab; Nasopharyngeal(NP) swabs in vial transport medium  Result Value Ref Range Status   SARS Coronavirus 2 by RT PCR NEGATIVE NEGATIVE Final    Comment: (NOTE) SARS-CoV-2 target nucleic acids are NOT DETECTED.  The SARS-CoV-2 RNA is generally detectable in upper respiratory specimens during the acute phase of infection. The lowest concentration of SARS-CoV-2 viral copies this assay can detect is 138 copies/mL. A negative result does not preclude SARS-Cov-2 infection and should not be used as the sole basis for treatment or other patient management decisions. A negative result may occur with  improper specimen collection/handling, submission of specimen other than nasopharyngeal swab, presence of viral mutation(s) within the areas targeted by this assay, and inadequate number of viral copies(<138 copies/mL). A negative result must be combined with clinical observations, patient history, and epidemiological information. The expected result is Negative.  Fact Sheet for Patients:  EntrepreneurPulse.com.au  Fact Sheet for Healthcare Providers:  IncredibleEmployment.be  This test is no t yet approved or cleared by the [place] FDA and  has been authorized for detection and/or diagnosis of SARS-CoV-2 by FDA under an Emergency Use  Authorization (EUA). This EUA will remain  in effect (meaning this test can be used) for the duration of the COVID-19 declaration under Section 564(b)(1) of the Act, 21 U.S.C.section 360bbb-3(b)(1), unless the authorization is terminated  or revoked sooner.       Influenza A by PCR NEGATIVE NEGATIVE Final   Influenza B by PCR NEGATIVE NEGATIVE Final    Comment: (NOTE) The Xpert Xpress SARS-CoV-2/FLU/RSV plus assay is intended as an aid in the diagnosis of influenza from Nasopharyngeal swab specimens and should not be used as a sole basis for treatment. Nasal washings and aspirates are unacceptable for Xpert Xpress SARS-CoV-2/FLU/RSV testing.  Fact Sheet for Patients: EntrepreneurPulse.com.au  Fact Sheet for Healthcare Providers: IncredibleEmployment.be  This test is not yet approved or cleared by the [place] FDA and has been authorized for detection and/or diagnosis of SARS-CoV-2 by FDA under an Emergency Use Authorization (EUA). This EUA will remain in effect (meaning this test can be used) for the duration of the COVID-19 declaration under Section 564(b)(1) of the Act, 21 U.S.C. section 360bbb-3(b)(1), unless the authorization is terminated or revoked.  Performed at [facility], [place] [address] [place], [place] [address]      Time coordinating discharge: Over 30 minutes  SIGNED:   Darliss Cheney, MD  [facility] 10/11/2021, 9:26 AM  If 7PM-7AM, please contact night-coverage [email or web address]

## 2021-10-11 NOTE — TOC Progression Note (Addendum)
Transition of Care [facility]) - Progression Note    Patient Details  Name: Roberta Bowman MRN: [id number] Date of Birth: 12/24/1985  Transition of Care [facility]) CM/SW Contact  Leone Haven, RN Phone Number: 10/11/2021, 10:06 AM  Clinical Narrative:    NCM spoke with patient, she will be on augmentin at dc, per [facility] the price is 18.54.  Also patient will need to follow up with oral surgery tomorrow per follow up on AVS.  Patient states she needs assistance with transportation at discharge and to get to her apt in the am.  NCM  spoke with [employer] supervisor to get approval for taxi voucher to go to apt in am.  Patient states she can get her way back home tomorrow.         Expected Discharge Plan and Services           Expected Discharge Date: 10/11/21                                     Social Determinants of Health (SDOH) Interventions    Readmission Risk Interventions No flowsheet data found.

## 2021-10-11 NOTE — Discharge Instructions (Signed)
Please remain n.p.o. from midnight tonight.

## 2021-10-11 NOTE — TOC Progression Note (Addendum)
Transition of Care [facility]) - Progression Note    Patient Details  Name: Roberta Bowman MRN: [id number] Date of Birth: 1986/05/29  Transition [facility]) CM/SW Contact  Levada Schilling Phone Number: 10/11/2021, 11:35 AM  Clinical Narrative:     CSW was consulted by RN to provide transportation home.  CSW confirmed pt's address.  CSW gave pt's RN a taxi voucher or disposition home. Transition of Care Department [facility]) has reviewed patient and no TOC needs have been identified at this time. We will continue to monitor patient advancement through interdisciplinary progression rounds. If new patient transition needs arise, please place a TOC consult.      Patient Details  Name: Roberta Bowman Date of Birth: [birth year]   Transition [facility]) CM/SW Contact:    Levada Schilling Phone Number: 10/11/2021, 12:27 PM       Patient Details  Name: Roberta Bowman Date of Birth: [birth year]   Transition of Care [facility]) CM/SW Contact:    Levada Schilling Phone Number: 10/11/2021, 12:21 PM           Expected Discharge Plan and Services           Expected Discharge Date: 10/11/21                                     Social Determinants of Health (SDOH) Interventions    Readmission Risk Interventions No flowsheet data found.

## 2021-10-14 LAB — CULTURE, BLOOD (ROUTINE X 2)
Culture: NO GROWTH
Culture: NO GROWTH
Special Requests: ADEQUATE
Special Requests: ADEQUATE

## 2021-10-19 ENCOUNTER — Ambulatory Visit: Payer: Self-pay | Attending: Nurse Practitioner | Admitting: Nurse Practitioner

## 2022-03-03 IMAGING — CT CT NECK W/ CM
3 of 5 series · 12 of 33 positions shown, 14 images · IV contrast (APPLIED)
Comparison: None.

CLINICAL DATA: Neck mass, nonpulsatile Swelling to submandibular
space

EXAM:
CT NECK WITH CONTRAST
TECHNIQUE: Multidetector CT imaging of the neck was performed using the
standard protocol following the bolus administration of intravenous
contrast.
CONTRAST:  75mL OMNIPAQUE IOHEXOL 300 MG/ML  SOLN

[Series 3: neck 2.0 i31s 3 · axial · 0.42mm/px · z∈[-258,-108]mm · 4 of 126 slices shown, 5 images]
[im 26/126  soft-tissue]
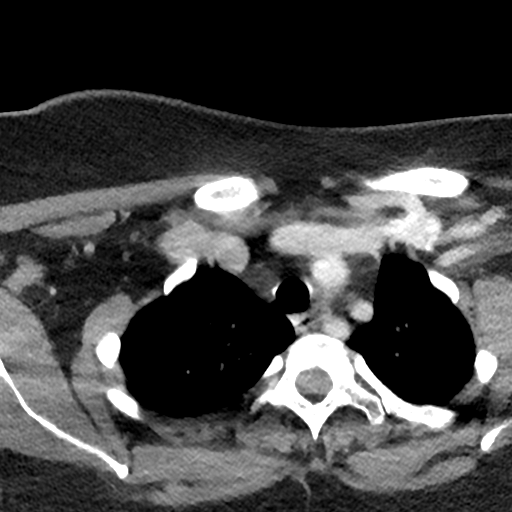
[im 26/126  bone]
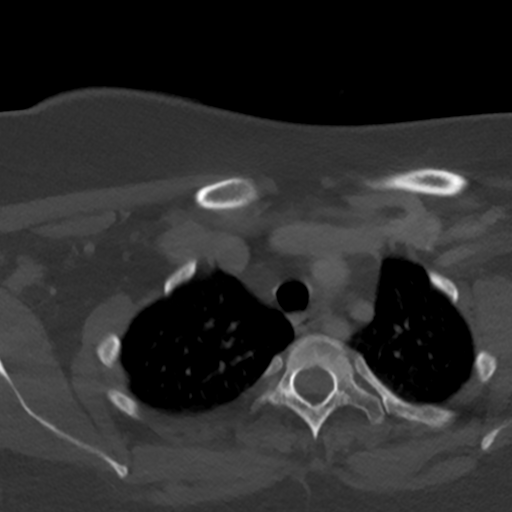
[im 51/126  bone]
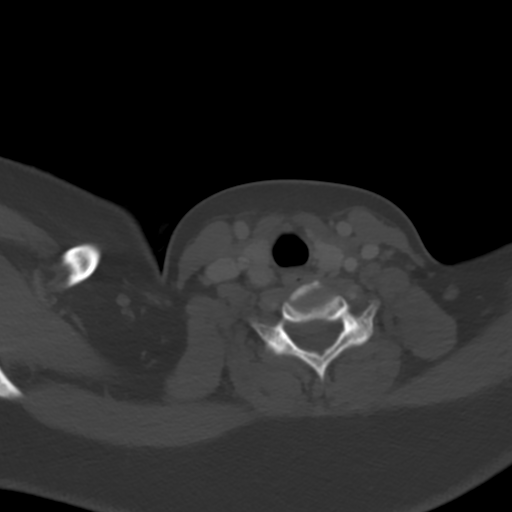
[im 76/126  bone]
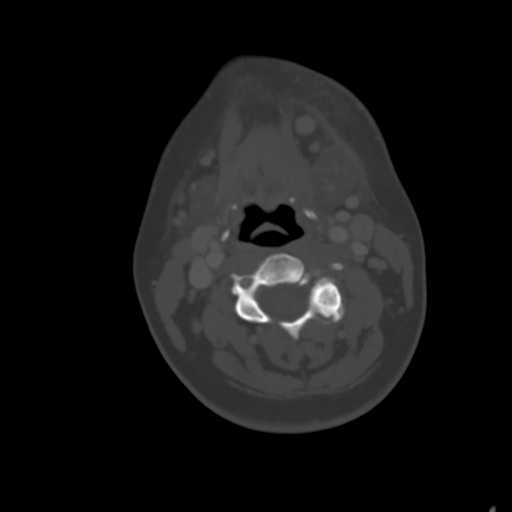
[im 101/126  bone]
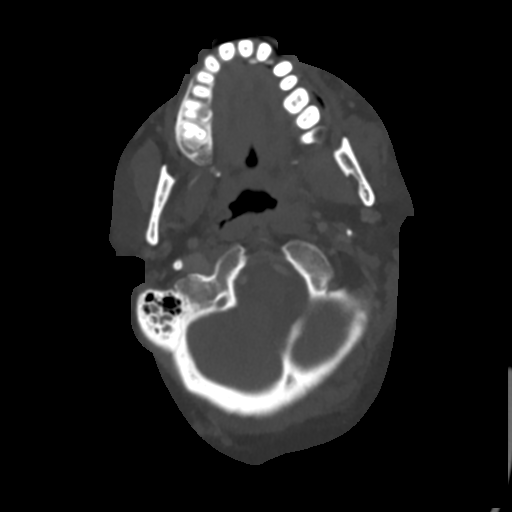

[Series 7: coronal st · coronal · 0.31mm/px · 3 of 109 slices shown]
[im 22/109  bone]
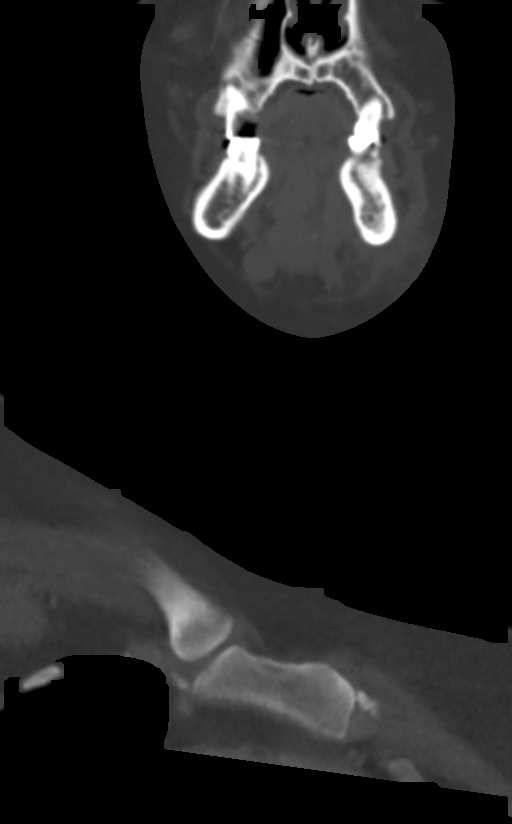
[im 44/109  bone]
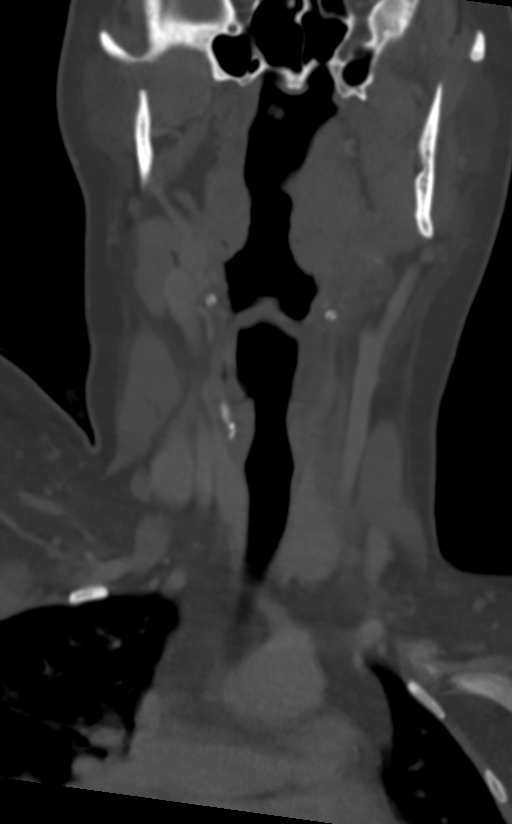
[im 65/109  bone]
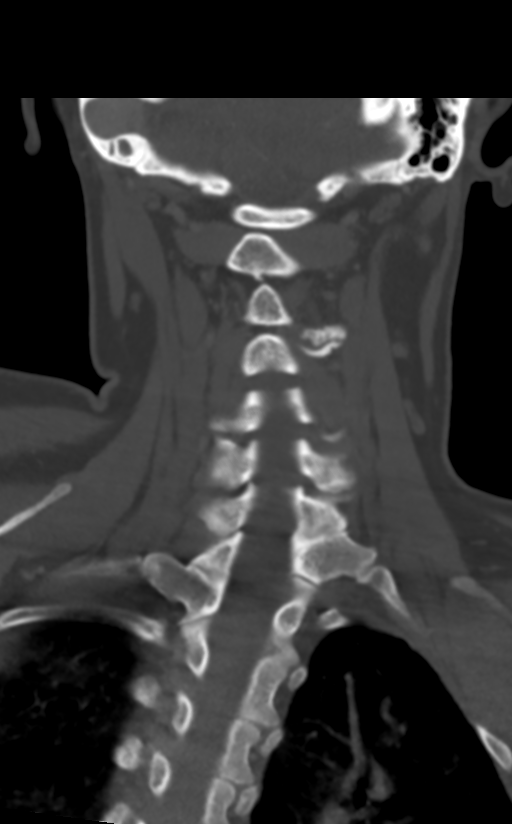

[Series 8: sagittal st · sagittal · 0.42mm/px · 5 of 101 slices shown, 6 images]
[im 34/101  bone]
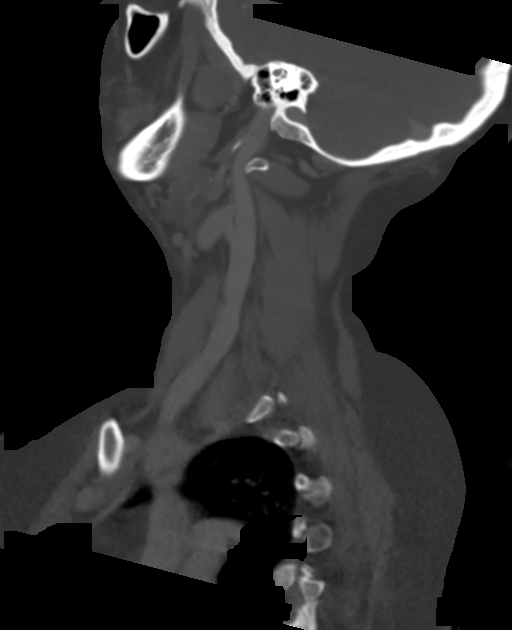
[im 42/101  bone]
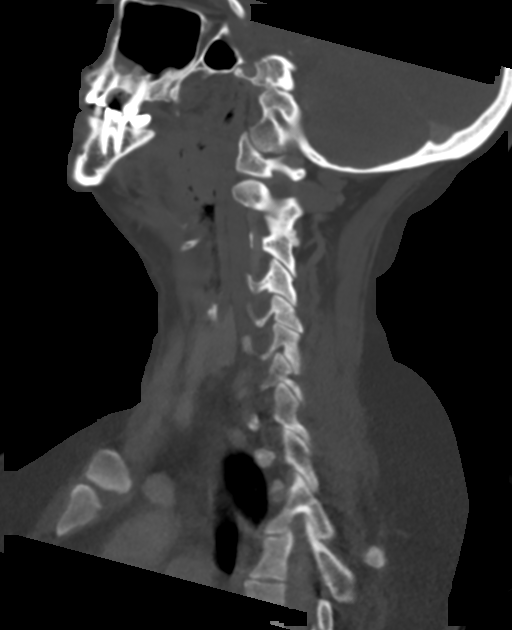
[im 51/101  soft-tissue]
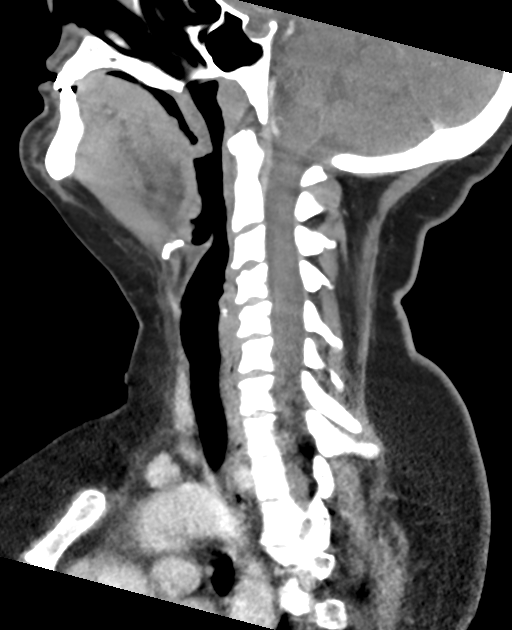
[im 51/101  bone]
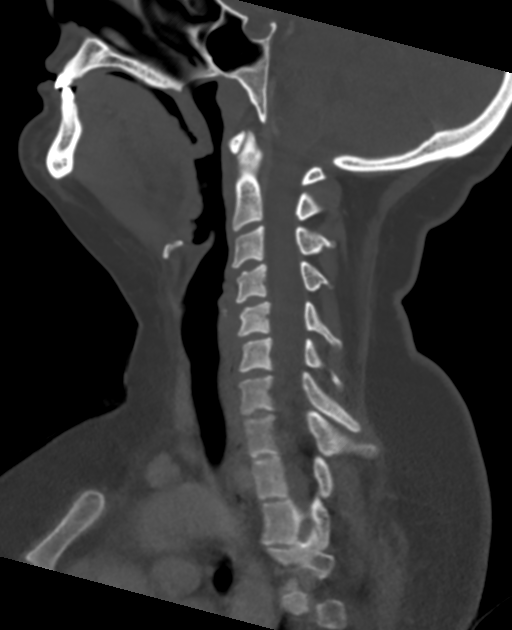
[im 59/101  bone]
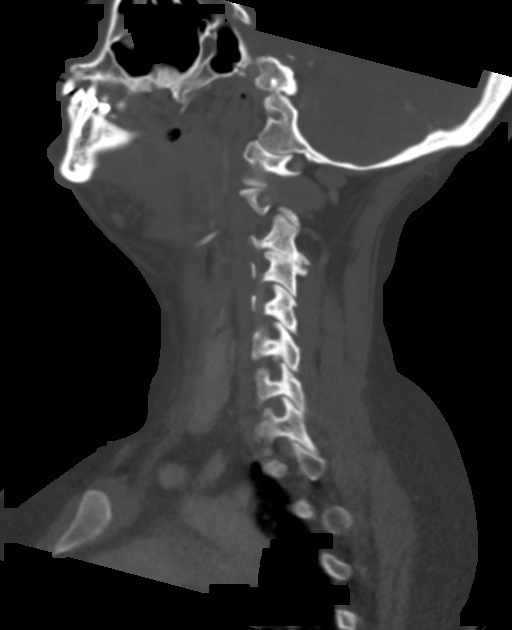
[im 67/101  bone]
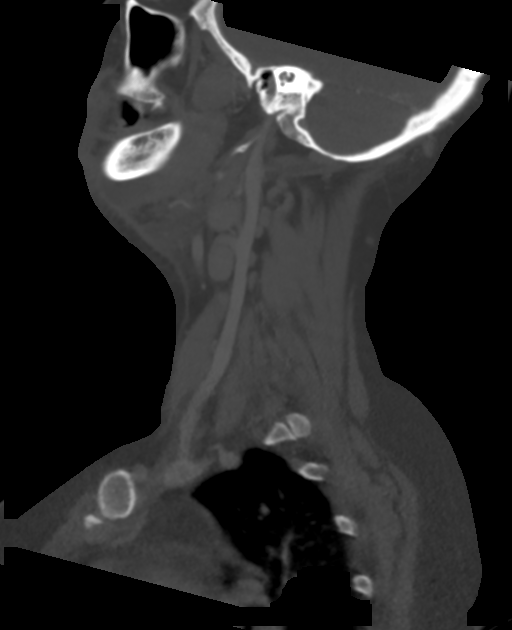

[12 of 33 positions shown; findings below may reference images not displayed]

FINDINGS: Pharynx and larynx: No evidence of a mass.

Salivary glands: Edema surrounding the left submandibular gland from
the process described below. Otherwise, the submandibular and
parotid glands are unremarkable.

Thyroid: Normal.

Lymph nodes: Enlarged left submandibular and left greater than right
upper cervical chain nodes.

Vascular: Limited evaluation due to non arterial timing with major
arteries in the neck appearing grossly patent.

Limited intracranial: Unremarkable.

Visualized orbits: Negative.

Mastoids and visualized paranasal sinuses: Mild mucosal thickening
of the inferior maxillary sinuses. Trace left mastoid fluid.

Skeleton: No acute abnormality.

Upper chest: Visualized lung apices are clear.

Other: Edema and soft tissue thickening along the inferior aspect of
the left mandibular body with approximately 2.3 x 2.3 by 1.4 cm
fluid collection with peripheral enhancement, compatible with
abscess. There is a low-attenuation fluid tract extending superiorly
to the lingual aspect of the left mandibular body with periapical
lucency and lingual cortical breakthrough of the posterior-most left
mandibular molar in this region (series 5, images 35/36).
Edema/cellulitis extends inferiorly along the platysma into the
upper neck and posteriorly into the submandibular space.
IMPRESSION: 1. Findings compatible with cellulitis/phlegmon centered along the
inferior aspect of the left mandibular body with 2.3 cm abscess in
this region. Findings are likely odontogenic in etiology given
tubular fluid tract extending superiorly to the posterior-most left
mandibular molar with overlying periapical lucency and lingual
cortical breakthrough.
2. Enlarged left submandibular and left greater than right upper
cervical chain nodes, nonspecific but likely reactive given the
above findings.

## 2022-03-04 IMAGING — DX DG ORTHOPANTOGRAM /PANORAMIC
1 series · 1 of 1 positions shown · non-contrast
Comparison: None.

CLINICAL DATA: Cellulitis, abscess.

EXAM:
ORTHOPANTOGRAM/PANORAMIC

[view not recorded]
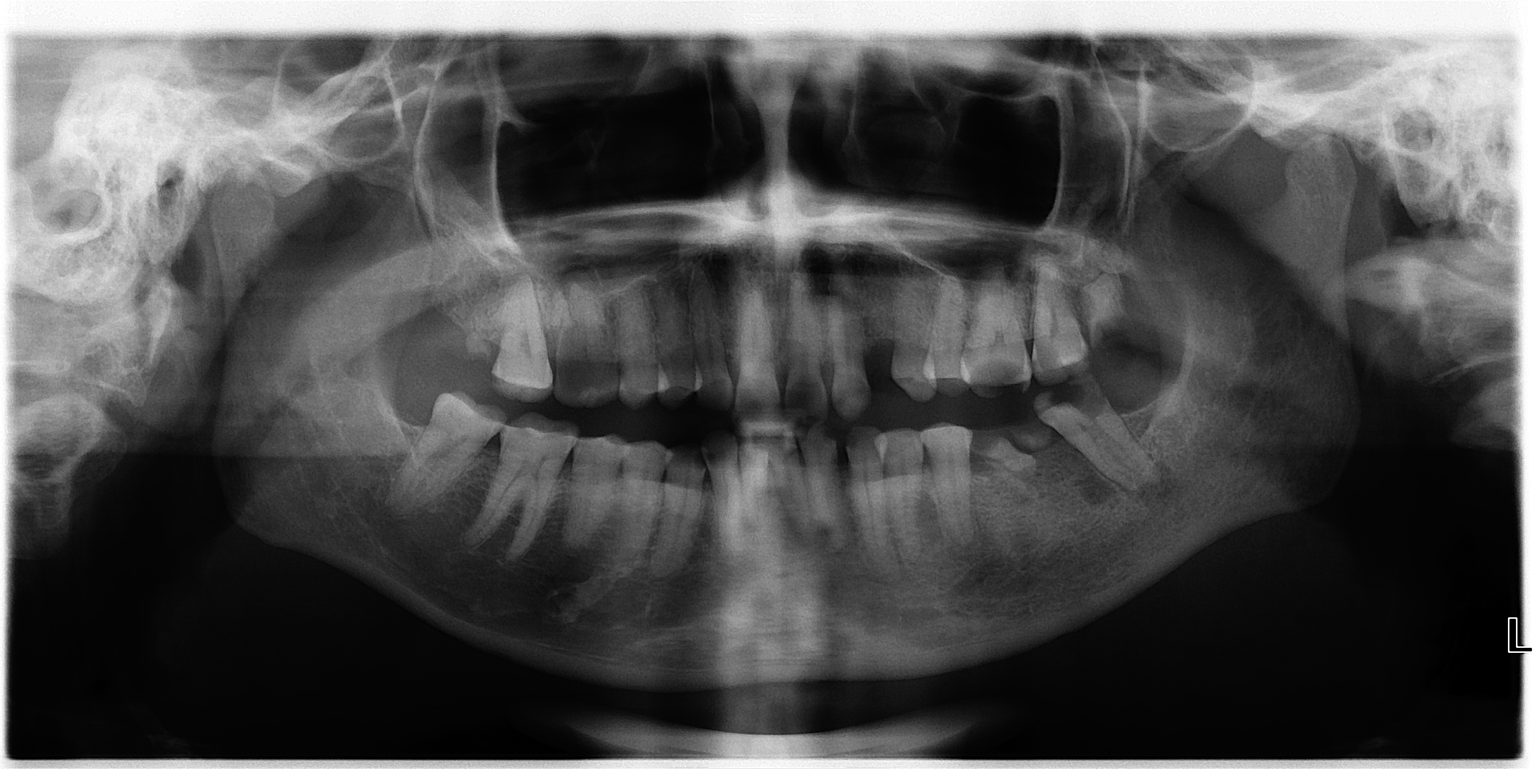

[1 of 1 positions shown; findings below may reference images not displayed]

FINDINGS: No fracture is noted. Poor dentition is noted posteriorly in the
left maxillary and mandibular regions. There is noted lucency around
the root of the left posterior molar in the mandible which may
represent infection.
IMPRESSION: No fracture is noted. Lucency noted around the root of the left
posterior molar and mandible which may represent infection.

## 2022-04-08 IMAGING — CT CT NECK W/ CM
3 of 4 series · 13 of 33 positions shown, 16 images · IV contrast (Omni 300)
Comparison: 09/03/2021

CLINICAL DATA: Face swelling, infection, pain, prior dental abscess
on the left

EXAM:
CT NECK WITH CONTRAST
TECHNIQUE: Multidetector CT imaging of the neck was performed using the
standard protocol following the bolus administration of intravenous
contrast.

[Series 3: neck 2.0 st · axial · 0.42mm/px · z∈[-254,-70]mm · 5 of 140 slices shown, 7 images]
[im 24/140  soft-tissue]
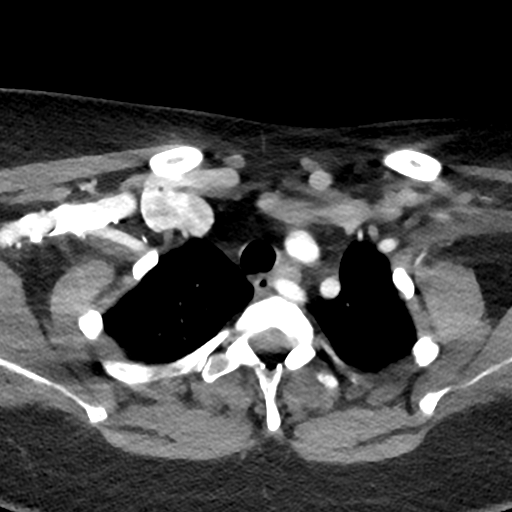
[im 24/140  bone]
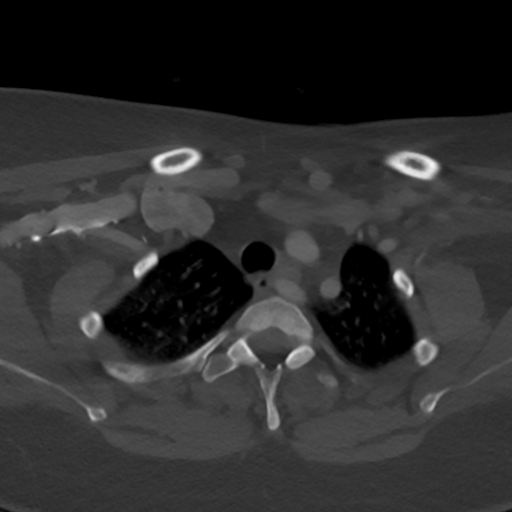
[im 47/140  bone]
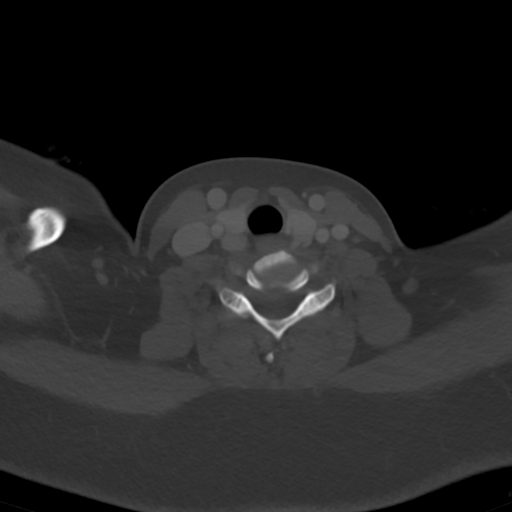
[im 70/140  bone]
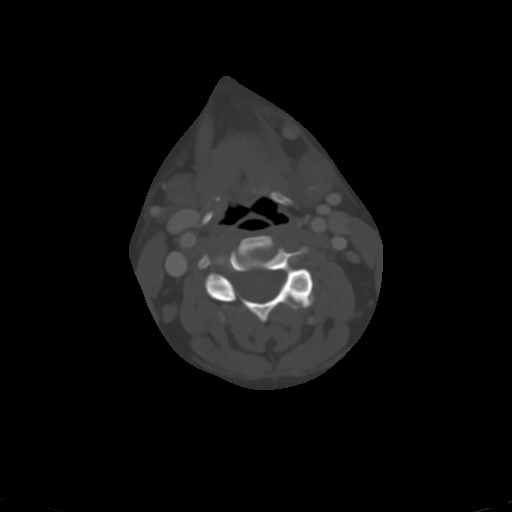
[im 93/140  bone]
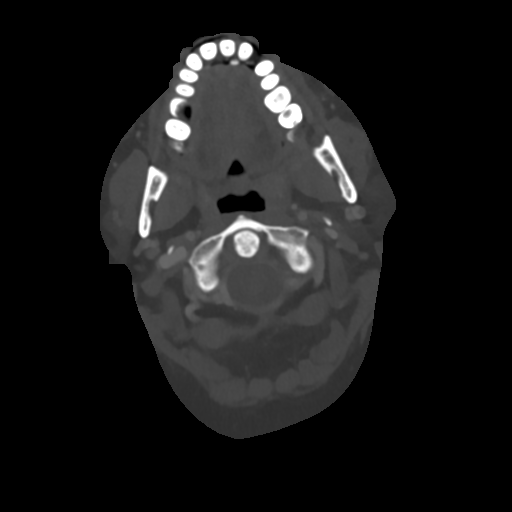
[im 116/140  soft-tissue]
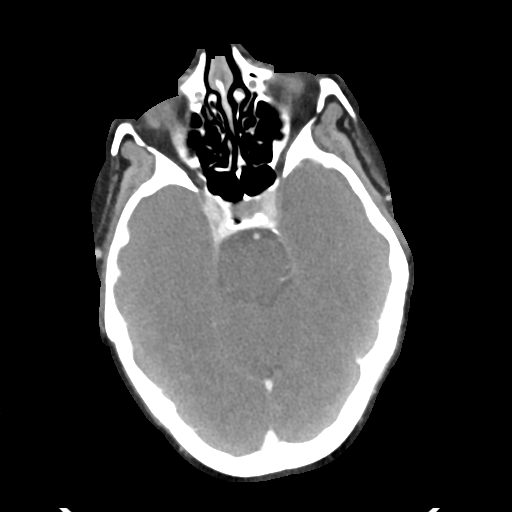
[im 116/140  bone]
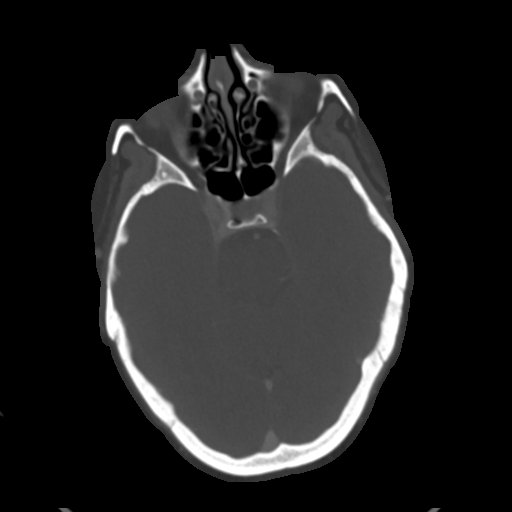

[Series 4: sagittal · sagittal · 0.48mm/px · 5 of 101 slices shown, 6 images]
[im 34/101  bone]
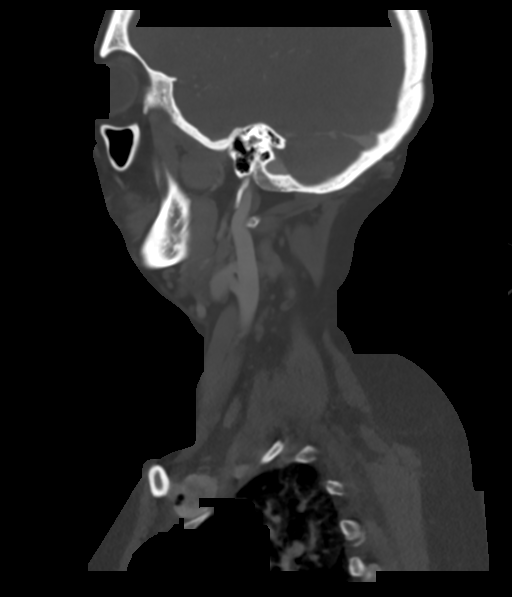
[im 42/101  bone]
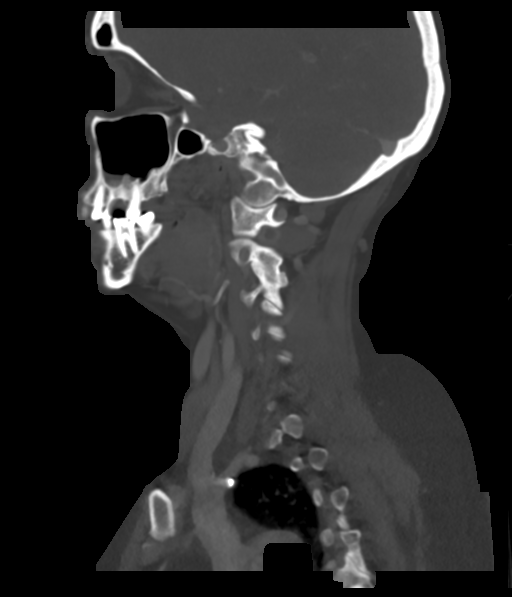
[im 51/101  soft-tissue]
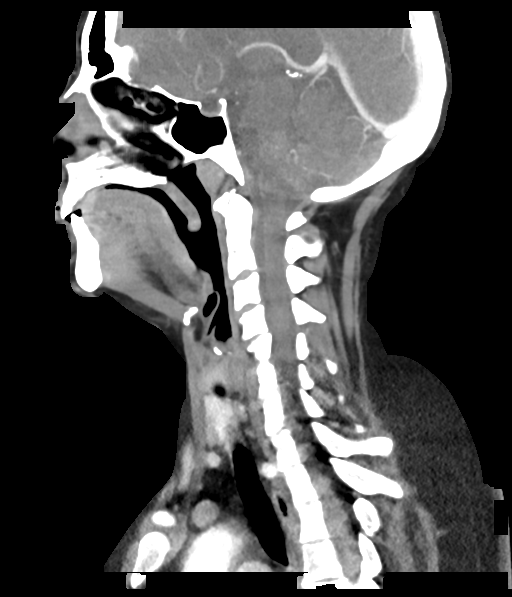
[im 51/101  bone]
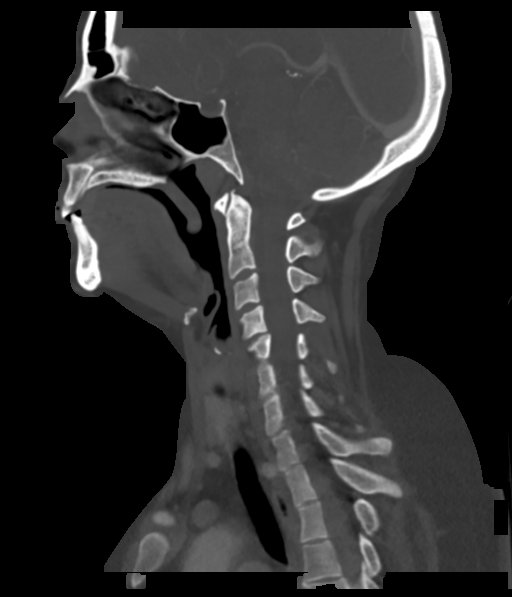
[im 59/101  bone]
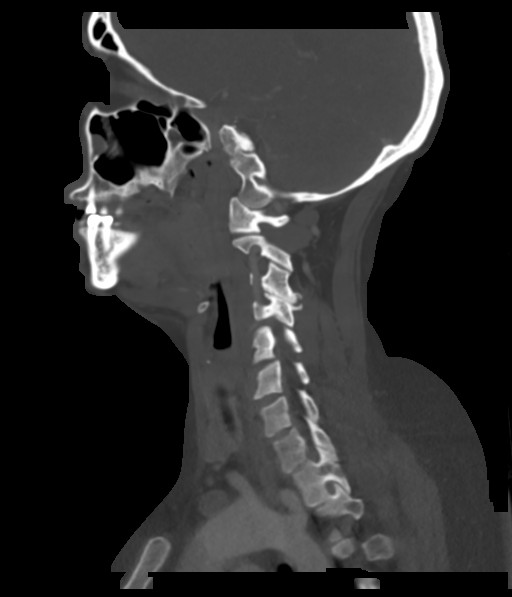
[im 67/101  bone]
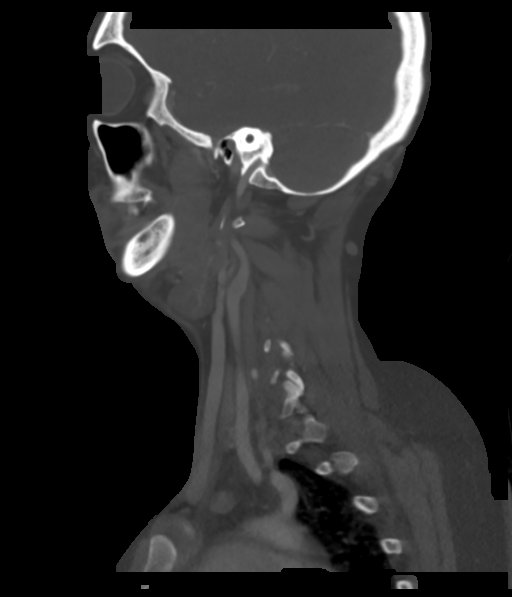

[Series 5: coronal · coronal · 0.36mm/px · 3 of 112 slices shown]
[im 23/112  bone]
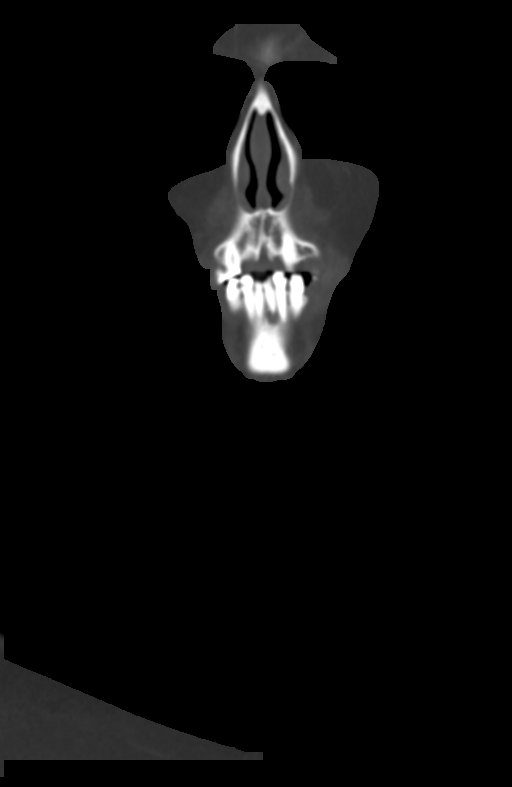
[im 45/112  bone]
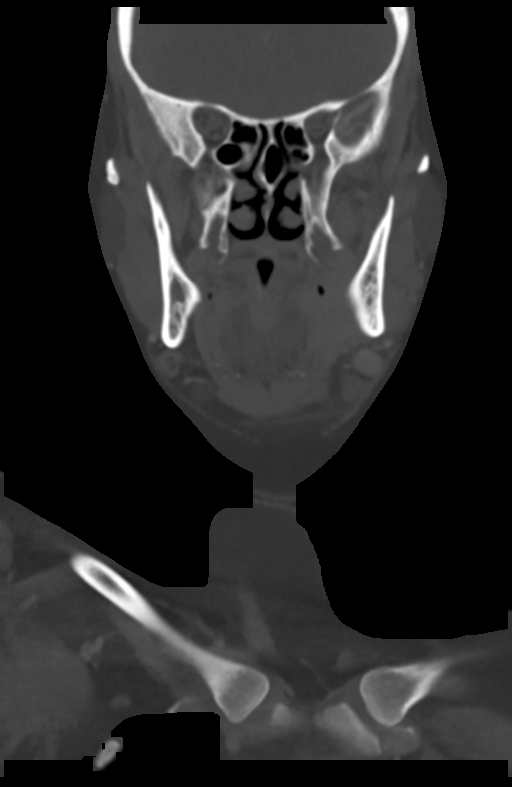
[im 67/112  bone]
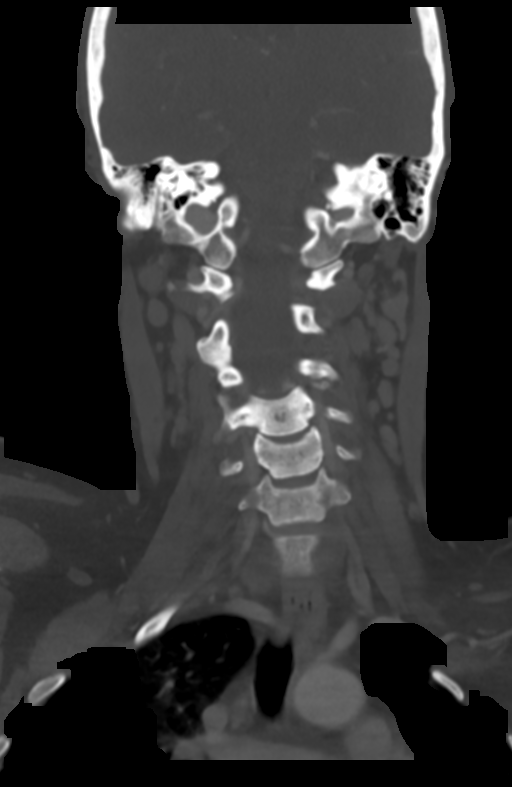

[13 of 33 positions shown; findings below may reference images not displayed]

RADIATION DOSE REDUCTION: This exam was performed according to the
departmental dose-optimization program which includes automated
exposure control, adjustment of the mA and/or kV according to
patient size and/or use of iterative reconstruction technique.

CONTRAST:  75mL OMNIPAQUE IOHEXOL 350 MG/ML SOLN
FINDINGS: Pharynx and larynx: No mass or swelling.

Salivary glands: No inflammation, mass, or stone.

Thyroid: Normal.

Lymph nodes: Enlarged left submandibular and left upper cervical
chain lymph nodes, which are stable to slightly decreased in size
compared to the prior exam. For example a left level 2A lymph node
measures up to 1.2 cm in short axis, previously 1.3 cm. A left level
1B lymph node measures 9 mm in short axis (series 3, image 62),
previously 10 mm.

Vascular: Negative.

Limited intracranial: Negative.

Visualized orbits: Negative.

Mastoids and visualized paranasal sinuses: Minimal mucosal
thickening in the right maxillary sinus. Otherwise negative. The
mastoids are well aerated.

Skeleton: No acute osseous abnormality.

Upper chest: Evaluation is somewhat limited by respiratory motion.
No focal pulmonary opacity or pleural effusion.

Other: Previously noted edema and fat stranding along the inferior
aspect of the left mandibular body has significantly decreased, with
only minimal fat stranding remaining (series 3, image 65). The
previously noted abscess is no longer seen. Periapical lucency
remains about the most posterior left mandibular molar (series 4,
image 65), with redemonstrated cortical breakthrough (series 4,
image 64).
IMPRESSION: 1. Significant improvement in the appearance of the left
submandibular tissues, with resolution of previously noted edema,
fat stranding, in abscess along the inferior aspect of the left
mandibular body. There is persistent periapical lucency about the
most posterior left mandibular molar, which continues to show
cortical breakthrough, but only mild stranding in the adjacent soft
tissues.
2. Redemonstrated enlarged left submandibular and upper cervical
chain lymph nodes, likely reactive.

## 2022-09-20 DIAGNOSIS — Z419 Encounter for procedure for purposes other than remedying health state, unspecified: Secondary | ICD-10-CM | POA: Diagnosis not present

## 2022-10-21 DIAGNOSIS — Z419 Encounter for procedure for purposes other than remedying health state, unspecified: Secondary | ICD-10-CM | POA: Diagnosis not present

## 2022-11-19 DIAGNOSIS — Z419 Encounter for procedure for purposes other than remedying health state, unspecified: Secondary | ICD-10-CM | POA: Diagnosis not present

## 2022-12-20 DIAGNOSIS — Z419 Encounter for procedure for purposes other than remedying health state, unspecified: Secondary | ICD-10-CM | POA: Diagnosis not present

## 2023-01-19 DIAGNOSIS — Z419 Encounter for procedure for purposes other than remedying health state, unspecified: Secondary | ICD-10-CM | POA: Diagnosis not present
